# Patient Record
Sex: Female | Born: 1960
Health system: Southern US, Community
[De-identification: ages and names within clinical notes are randomized; demographics above are authoritative.]

## PROBLEM LIST (undated history)

## (undated) DIAGNOSIS — J349 Unspecified disorder of nose and nasal sinuses: Secondary | ICD-10-CM

## (undated) DIAGNOSIS — G4700 Insomnia, unspecified: Secondary | ICD-10-CM

## (undated) DIAGNOSIS — F419 Anxiety disorder, unspecified: Secondary | ICD-10-CM

## (undated) HISTORY — PX: WISDOM TOOTH EXTRACTION: SHX21

## (undated) HISTORY — PX: TONSILLECTOMY: SUR1361

---

## 2008-03-23 HISTORY — PX: LIPOSUCTION TRUNK: SUR833

## 2008-03-23 HISTORY — PX: BREAST ENHANCEMENT SURGERY: SHX7

## 2012-01-08 NOTE — H&P (Signed)
Assessment  . Facial pain   (784.0) . Pain in TMJ   (526.9) . Abnormal auditory perception   (388.40) . Tinnitus   (388.30) . Rhinitis   (472.0) Orders  Audiological Evaluation; Comprehensive Audiometry; Tympanometry Bilateral; Tympanometry Bilateral; Requested for: 12 Oct 2011. GENT Radiology Study (In Office); [phone] CT Limited Sinus w/o contrast (Axial); Requested for: 12 Oct 2011. Amoxicillin-Pot Clavulanate 875-125 MG Oral Tablet;TAKE 1 TABLET EVERY 12 HOURS DAILY; Qty42; R0; Rx. PredniSONE 20 MG Oral Tablet;TAKE 3 TABLETS DAILY FOR 3 DAYS, THEN 2 TABLETS DAILY FOR 3 DAYS, THEN 1 TABLET DAILY FOR 3 DAYS; Qty22; R0; Rx. Discussed  Significant chronic pansinusitis. We'll treat with Augmentin and a tapered course of prednisone for 3 weeks. Recheck with repeat imaging at that point. We discussed possible future surgical intervention if things do not clear up. Hearing and ears are normal. Reason For Visit  Melissa Stephenson is here today at the kind request of DEWEY, ELIZABETH for consultation and opinion for ringing in ears and sinus. HPI  Several year history of recurring bouts of sinus symptoms including facial pain and pressure, nasal congestion. She has been treated with multiple courses of antibiotics without any significant relief. She had plain x-rays of the sinuses last year at an urgent care center and was told she had a sinus infection. She has minimal nasal discharge. She does have sometimes nasal congestion. She also complains of some probable loss of hearing and a pulsatile tinnitus that occurs only occasionally. It seems to be worse on the left. She has had problems with wax buildup and itching of her ears. She is under a lot of stress. Allergies  No Known Drug Allergies. Current Meds  Amitriptyline HCl - 10 MG Oral Tablet;; RPT Zolpidem Tartrate 10 MG Oral Tablet;; RPT. PSH  Oral Surgery Tooth Extraction Tonsillectomy. Family Hx  Family history of Diabetes Mellitus. Personal Hx   Alcohol Use;  * 2 drinks or less a day, 12 Oct 2011 Never A Smoker. ROS  12 system ROS was obtained and reviewed on the Health Maintenance form dated today.  Positive responses are shown above.  If the symptom is not checked, the patient has denied it. Vital Signs   Recorded by [facility] on 12 Oct 2011 02:13 PM BP:110/60,  Height: 69.5 in, Weight: 158 lb, BMI: 23 kg/m2,  BSA Calculated: 1.88 ,  BMI Calculated: 23.00. Physical Exam  APPEARANCE: Well developed, well nourished, in no acute distress.  Normal affect, in a pleasant mood.  Oriented to time, place and person. COMMUNICATION: Normal voice   HEAD & FACE:  No scars, lesions or masses of head and face.  Sinuses nontender to palpation.  Salivary glands without mass or tenderness.  Facial strength symmetric.  No facial lesion, scars, or mass. EYES: EOMI with normal primary gaze alignment. Visual acuity grossly intact.  PERRLA EXTERNAL EAR & NOSE: No scars, lesions or masses  EAC & TYMPANIC MEMBRANE:  EAC shows no obstructing lesions or debris and tympanic membranes are normal bilaterally with good movement to insufflation. GROSS HEARING: Normal   TMJ: There is some anterior subluxation of the mandible when she opens. There is an audible click at times also when she opens. INTRANASAL EXAM: No polyps or purulence.  NASOPHARYNX: Normal, without lesions. LIPS, TEETH & GUMS: No lip lesions, normal dentition and normal gums. ORAL CAVITY/OROPHARYNX:  Oral mucosa moist without lesion or asymmetry of the palate, tongue, tonsil or posterior pharynx. NECK:  Supple without adenopathy or mass. THYROID:  Normal with no masses palpable.  NEUROLOGIC:  No gross CN deficits. No nystagmus noted.   LYMPHATIC:  No enlarged nodes palpable. Results  CT reveals chronic pansinusitis with possible polypoid component.   Tympanograms are normal in the audiogram reveals minimal high-frequency hearing loss. Signature  Electronically signed by : Serena Colonel  M.D.; 10/12/2011 4:01 PM EST.

## 2012-01-14 ENCOUNTER — Encounter (HOSPITAL_BASED_OUTPATIENT_CLINIC_OR_DEPARTMENT_OTHER): Payer: Self-pay | Admitting: *Deleted

## 2012-01-14 NOTE — Progress Notes (Signed)
Pt working Proofreader finished antibiotic for URI-can take Claritin. No cardiac or resp problems

## 2012-01-18 ENCOUNTER — Encounter (HOSPITAL_BASED_OUTPATIENT_CLINIC_OR_DEPARTMENT_OTHER): Payer: Self-pay | Admitting: *Deleted

## 2012-01-18 ENCOUNTER — Ambulatory Visit (HOSPITAL_BASED_OUTPATIENT_CLINIC_OR_DEPARTMENT_OTHER)
Admission: RE | Admit: 2012-01-18 | Discharge: 2012-01-18 | Disposition: A | Discharge status: Home / Self Care | Payer: BC Managed Care – PPO | Source: Ambulatory Visit | Attending: Otolaryngology | Admitting: Otolaryngology

## 2012-01-18 ENCOUNTER — Encounter (HOSPITAL_BASED_OUTPATIENT_CLINIC_OR_DEPARTMENT_OTHER): Payer: Self-pay | Admitting: Anesthesiology

## 2012-01-18 ENCOUNTER — Ambulatory Visit (HOSPITAL_BASED_OUTPATIENT_CLINIC_OR_DEPARTMENT_OTHER): Payer: BC Managed Care – PPO | Admitting: Anesthesiology

## 2012-01-18 ENCOUNTER — Encounter (HOSPITAL_BASED_OUTPATIENT_CLINIC_OR_DEPARTMENT_OTHER)
Admission: RE | Disposition: A | Discharge status: Home / Self Care | Payer: Self-pay | Source: Ambulatory Visit | Attending: Otolaryngology

## 2012-01-18 DIAGNOSIS — J329 Chronic sinusitis, unspecified: Secondary | ICD-10-CM | POA: Insufficient documentation

## 2012-01-18 DIAGNOSIS — J338 Other polyp of sinus: Secondary | ICD-10-CM | POA: Insufficient documentation

## 2012-01-18 HISTORY — DX: Unspecified disorder of nose and nasal sinuses: J34.9

## 2012-01-18 HISTORY — PX: NASAL SINUS SURGERY: SHX719

## 2012-01-18 HISTORY — DX: Insomnia, unspecified: G47.00

## 2012-01-18 HISTORY — DX: Anxiety disorder, unspecified: F41.9

## 2012-01-18 LAB — POCT HEMOGLOBIN-HEMACUE: Hemoglobin: 12 g/dL (ref 12.0–15.0)

## 2012-01-18 SURGERY — SINUS SURGERY, ENDOSCOPIC
Anesthesia: General | Site: Nose | Laterality: Bilateral | Wound class: Clean Contaminated

## 2012-01-18 MED ORDER — PROMETHAZINE HCL 25 MG RE SUPP: 25.0000 mg | Freq: Four times a day (QID) | RECTAL | Status: DC | PRN

## 2012-01-18 MED ORDER — DEXAMETHASONE SODIUM PHOSPHATE 4 MG/ML IJ SOLN
INTRAMUSCULAR | Status: DC | PRN
  Administered 2012-01-18: 10 mg via INTRAVENOUS

## 2012-01-18 MED ORDER — LACTATED RINGERS IV SOLN
INTRAVENOUS | Status: DC
  Administered 2012-01-18 (×2): via INTRAVENOUS

## 2012-01-18 MED ORDER — HYDROCODONE-ACETAMINOPHEN 7.5-325 MG PO TABS: 1.0000 | ORAL_TABLET | Freq: Four times a day (QID) | ORAL | Status: DC | PRN

## 2012-01-18 MED ORDER — CEFAZOLIN SODIUM-DEXTROSE 2-3 GM-% IV SOLR
2.0000 g | INTRAVENOUS | Status: AC
  Administered 2012-01-18: 2 g via INTRAVENOUS

## 2012-01-18 MED ORDER — GLYCOPYRROLATE 0.2 MG/ML IJ SOLN
0.2000 mg | Freq: Once | INTRAMUSCULAR | Status: AC | PRN
  Administered 2012-01-18: 0.2 mg via INTRAVENOUS

## 2012-01-18 MED ORDER — MIDAZOLAM HCL 5 MG/5ML IJ SOLN
INTRAMUSCULAR | Status: DC | PRN
  Administered 2012-01-18: 2 mg via INTRAVENOUS

## 2012-01-18 MED ORDER — PROPOFOL 10 MG/ML IV BOLUS
INTRAVENOUS | Status: DC | PRN
  Administered 2012-01-18: 200 mg via INTRAVENOUS

## 2012-01-18 MED ORDER — LIDOCAINE HCL (CARDIAC) 10 MG/ML IV SOLN: INTRAVENOUS | Status: DC | PRN

## 2012-01-18 MED ORDER — LIDOCAINE-EPINEPHRINE 1 %-1:100000 IJ SOLN
INTRAMUSCULAR | Status: DC | PRN
  Administered 2012-01-18: 9 mL

## 2012-01-18 MED ORDER — LIDOCAINE HCL (CARDIAC) 10 MG/ML IV SOLN
INTRAVENOUS | Status: DC | PRN
  Administered 2012-01-18: 50 mg via INTRAVENOUS

## 2012-01-18 MED ORDER — CEPHALEXIN 500 MG PO CAPS: 500.0000 mg | ORAL_CAPSULE | Freq: Three times a day (TID) | ORAL | Status: DC

## 2012-01-18 MED ORDER — ROCURONIUM BROMIDE 100 MG/10ML IV SOLN
INTRAVENOUS | Status: DC | PRN
  Administered 2012-01-18: 50 mg via INTRAVENOUS

## 2012-01-18 MED ORDER — NEOSTIGMINE METHYLSULFATE 1 MG/ML IJ SOLN
INTRAMUSCULAR | Status: DC | PRN
  Administered 2012-01-18: 3 mg via INTRAVENOUS

## 2012-01-18 MED ORDER — OXYMETAZOLINE HCL 0.05 % NA SOLN
NASAL | Status: DC | PRN
  Administered 2012-01-18: 1 via NASAL

## 2012-01-18 MED ORDER — METOCLOPRAMIDE HCL 5 MG/ML IJ SOLN
INTRAMUSCULAR | Status: DC | PRN
  Administered 2012-01-18: 10 mg via INTRAVENOUS

## 2012-01-18 MED ORDER — HYDROMORPHONE HCL PF 1 MG/ML IJ SOLN
0.2500 mg | INTRAMUSCULAR | Status: DC | PRN
  Administered 2012-01-18 (×4): 0.5 mg via INTRAVENOUS

## 2012-01-18 MED ORDER — BACIT-POLY-NEO HC 1 % EX OINT
TOPICAL_OINTMENT | CUTANEOUS | Status: DC | PRN
  Administered 2012-01-18: 1 via TOPICAL

## 2012-01-18 MED ORDER — OXYMETAZOLINE HCL 0.05 % NA SOLN
2.0000 | NASAL | Status: DC
  Administered 2012-01-18 (×2): 2 via NASAL

## 2012-01-18 MED ORDER — ONDANSETRON HCL 4 MG/2ML IJ SOLN: 4.0000 mg | Freq: Once | INTRAMUSCULAR | Status: DC | PRN

## 2012-01-18 MED ORDER — GLYCOPYRROLATE 0.2 MG/ML IJ SOLN
INTRAMUSCULAR | Status: DC | PRN
  Administered 2012-01-18: 0.4 mg via INTRAVENOUS
  Administered 2012-01-18: 0.2 mg via INTRAVENOUS

## 2012-01-18 MED ORDER — OXYCODONE HCL 5 MG/5ML PO SOLN: 5.0000 mg | Freq: Once | ORAL | Status: AC | PRN

## 2012-01-18 MED ORDER — ONDANSETRON HCL 4 MG/2ML IJ SOLN
INTRAMUSCULAR | Status: DC | PRN
  Administered 2012-01-18 (×2): 4 mg via INTRAVENOUS

## 2012-01-18 MED ORDER — OXYCODONE HCL 5 MG PO TABS
5.0000 mg | ORAL_TABLET | Freq: Once | ORAL | Status: AC | PRN
  Administered 2012-01-18: 5 mg via ORAL

## 2012-01-18 MED ORDER — FENTANYL CITRATE 0.05 MG/ML IJ SOLN
INTRAMUSCULAR | Status: DC | PRN
  Administered 2012-01-18: 50 ug via INTRAVENOUS
  Administered 2012-01-18 (×2): 25 ug via INTRAVENOUS

## 2012-01-18 SURGICAL SUPPLY — 48 items
ATTRACTOMAT 16X20 MAGNETIC DRP (DRAPES) ×2 IMPLANT
BLADE RAD40 ROTATE 4M 4 5PK (BLADE) IMPLANT
BLADE RAD60 ROTATE M4 4 5PK (BLADE) IMPLANT
BLADE ROTATE RAD12 5PK M4 4MM (BLADE) IMPLANT
BLADE TRICUT ROTATE M4 4 5PK (BLADE) ×2 IMPLANT
BUR HS RAD FRONTAL 3 (BURR) IMPLANT
CANISTER SUC SOCK COL 7 IN (MISCELLANEOUS) ×2 IMPLANT
CANISTER SUCTION 1200CC (MISCELLANEOUS) ×4 IMPLANT
CATH SINUS BALLN 7X16 (CATHETERS) IMPLANT
CATH SINUS BALLN RELIEV 6X16 (SINUPLASTY) IMPLANT
CATH SINUS GUIDE F-70 (CATHETERS) IMPLANT
CATH SINUS GUIDE M/110 (CATHETERS) IMPLANT
CATH SINUS IRRIGATION 2.0 (CATHETERS) IMPLANT
CLOTH BEACON ORANGE TIMEOUT ST (SAFETY) ×2 IMPLANT
CORDS BIPOLAR (ELECTRODE) IMPLANT
DECANTER SPIKE VIAL GLASS SM (MISCELLANEOUS) ×2 IMPLANT
DEVICE INFLATION 20/61 (MISCELLANEOUS) IMPLANT
DRESSING ADAPTIC 1/2  N-ADH (PACKING) IMPLANT
DRESSING NASAL KENNEDY 3.5X.9 (MISCELLANEOUS) IMPLANT
DRSG NASAL KENNEDY 3.5X.9 (MISCELLANEOUS)
DRSG NASAL KENNEDY LMNT 8CM (GAUZE/BANDAGES/DRESSINGS) IMPLANT
DRSG NASOPORE 8CM (GAUZE/BANDAGES/DRESSINGS) ×2 IMPLANT
DRSG TELFA 3X8 NADH (GAUZE/BANDAGES/DRESSINGS) IMPLANT
GAUZE SPONGE 4X4 16PLY XRAY LF (GAUZE/BANDAGES/DRESSINGS) IMPLANT
GAUZE VASELINE FOILPK 1/2 X 72 (GAUZE/BANDAGES/DRESSINGS) IMPLANT
GLOVE BIO SURGEON STRL SZ 6.5 (GLOVE) ×2 IMPLANT
GLOVE ECLIPSE 7.5 STRL STRAW (GLOVE) ×2 IMPLANT
GOWN PREVENTION PLUS XLARGE (GOWN DISPOSABLE) ×4 IMPLANT
HANDLE SINUS GUIDE LP (INSTRUMENTS) IMPLANT
HEMOSTAT SURGICEL .5X2 ABSORB (HEMOSTASIS) IMPLANT
IV NS 500ML (IV SOLUTION) ×1
IV NS 500ML BAXH (IV SOLUTION) ×1 IMPLANT
NEEDLE 27GAX1X1/2 (NEEDLE) ×2 IMPLANT
NEEDLE SPNL 25GX3.5 QUINCKE BL (NEEDLE) IMPLANT
NS IRRIG 1000ML POUR BTL (IV SOLUTION) ×2 IMPLANT
PACK BASIN DAY SURGERY FS (CUSTOM PROCEDURE TRAY) ×2 IMPLANT
PACK ENT DAY SURGERY (CUSTOM PROCEDURE TRAY) ×2 IMPLANT
PATTIES SURGICAL .5 X3 (DISPOSABLE) ×2 IMPLANT
SLEEVE SCD COMPRESS KNEE MED (MISCELLANEOUS) ×2 IMPLANT
SOLUTION BUTLER CLEAR DIP (MISCELLANEOUS) ×2 IMPLANT
SPONGE GAUZE 2X2 8PLY STRL LF (GAUZE/BANDAGES/DRESSINGS) ×2 IMPLANT
SPONGE SURGIFOAM ABS GEL 12-7 (HEMOSTASIS) IMPLANT
SYSTEM RELIEVA LUMA ILLUM (SINUPLASTY) IMPLANT
TOWEL OR 17X24 6PK STRL BLUE (TOWEL DISPOSABLE) ×4 IMPLANT
TOWEL OR NON WOVEN STRL DISP B (DISPOSABLE) ×2 IMPLANT
TUBE CONNECTING 20X1/4 (TUBING) ×2 IMPLANT
WATER STERILE IRR 1000ML POUR (IV SOLUTION) IMPLANT
YANKAUER SUCT BULB TIP NO VENT (SUCTIONS) ×2 IMPLANT

## 2012-01-18 NOTE — Transfer of Care (Signed)
Immediate Anesthesia Transfer of Care Note  Patient: Melissa Stephenson  Procedure(s) Performed: Procedure(s) (LRB) with comments: ENDOSCOPIC SINUS SURGERY (Bilateral) - Polypectomy also  Patient Location: PACU  Anesthesia Type:General  Level of Consciousness: sedated and patient cooperative  Airway & Oxygen Therapy: Patient Spontanous Breathing and aerosol face mask  Post-op Assessment: Report given to PACU RN and Post -op Vital signs reviewed and stable  Post vital signs: Reviewed and stable  Complications: No apparent anesthesia complications

## 2012-01-18 NOTE — Progress Notes (Signed)
Robinul 0.2mg  given iv by Derek Jack, crna for vagal response after iv started

## 2012-01-18 NOTE — Anesthesia Preprocedure Evaluation (Signed)
Anesthesia Evaluation  Patient identified by MRN, date of birth, ID band Patient awake    Reviewed: Allergy & Precautions, H&P , NPO status , Patient's Chart, lab work & pertinent test results  Airway Mallampati: I TM Distance: >3 FB Neck ROM: Full    Dental  (+) Teeth Intact and Dental Advisory Given   Pulmonary  breath sounds clear to auscultation        Cardiovascular Rhythm:Regular Rate:Normal     Neuro/Psych Anxiety    GI/Hepatic   Endo/Other    Renal/GU      Musculoskeletal   Abdominal   Peds  Hematology   Anesthesia Other Findings   Reproductive/Obstetrics                           Anesthesia Physical Anesthesia Plan  ASA: I  Anesthesia Plan: General   Post-op Pain Management:    Induction: Intravenous  Airway Management Planned: Oral ETT  Additional Equipment:   Intra-op Plan:   Post-operative Plan: Extubation in OR  Informed Consent: I have reviewed the patients History and Physical, chart, labs and discussed the procedure including the risks, benefits and alternatives for the proposed anesthesia with the patient or authorized representative who has indicated his/her understanding and acceptance.   Dental advisory given  Plan Discussed with: CRNA, Anesthesiologist and Surgeon  Anesthesia Plan Comments:         Anesthesia Quick Evaluation

## 2012-01-18 NOTE — Anesthesia Procedure Notes (Signed)
Procedure Name: Intubation Date/Time: 01/18/2012 7:48 AM Performed by: Gar Gibbon Pre-anesthesia Checklist: Patient identified, Emergency Drugs available, Suction available and Patient being monitored Patient Re-evaluated:Patient Re-evaluated prior to inductionOxygen Delivery Method: Circle System Utilized Preoxygenation: Pre-oxygenation with 100% oxygen Intubation Type: IV induction Ventilation: Mask ventilation without difficulty Laryngoscope Size: Mac and 3 Grade View: Grade II Tube type: Oral Rae Tube size: 7.0 mm Number of attempts: 1 Airway Equipment and Method: stylet and oral airway Placement Confirmation: ETT inserted through vocal cords under direct vision,  positive ETCO2 and breath sounds checked- equal and bilateral Secured at: 20 cm Tube secured with: Tape Dental Injury: Teeth and Oropharynx as per pre-operative assessment

## 2012-01-18 NOTE — Interval H&P Note (Signed)
History and Physical Interval Note:  01/18/2012 7:31 AM  Melissa Stephenson  has presented today for surgery, with the diagnosis of chronic sinusitis  The various methods of treatment have been discussed with the patient and family. After consideration of risks, benefits and other options for treatment, the patient has consented to  Procedure(s) (LRB) with comments: ENDOSCOPIC SINUS SURGERY (Bilateral) as a surgical intervention .  The patient's history has been reviewed, patient examined, no change in status, stable for surgery.  I have reviewed the patient's chart and labs.  Questions were answered to the patient's satisfaction.     [name]

## 2012-01-18 NOTE — Anesthesia Postprocedure Evaluation (Addendum)
  Anesthesia Post-op Note  Patient: Melissa Stephenson  Procedure(s) Performed: Procedure(s) (LRB) with comments: ENDOSCOPIC SINUS SURGERY (Bilateral) - Polypectomy also  Patient Location: PACU  Anesthesia Type: General  Level of Consciousness: awake, alert  and oriented  Airway and Oxygen Therapy: Patient Spontanous Breathing and Patient connected to face mask oxygen  Post-op Pain: mild  Post-op Assessment: Post-op Vital signs reviewed  Post-op Vital Signs: Reviewed  Complications: No apparent anesthesia complications

## 2012-01-18 NOTE — Addendum Note (Signed)
Addendum  created 01/18/12 1623 by Kerby Nora, MD   Modules edited:Notes Section

## 2012-01-18 NOTE — Op Note (Signed)
OPERATIVE REPORT  DATE OF SURGERY: 01/18/2012  PATIENT:  Melissa Stephenson,  51 y.o. female  PRE-OPERATIVE DIAGNOSIS:  chronic sinusitis  POST-OPERATIVE DIAGNOSIS:  chronic sinusitis  PROCEDURE:  Procedure(s): ENDOSCOPIC SINUS SURGERY  SURGEON:  Susy Frizzle, MD  ASSISTANTS: none   ANESTHESIA:   general  EBL:  100 ml  DRAINS: none   LOCAL MEDICATIONS USED:  OTHER 1% Xylocaine with epinephrine  SPECIMEN:  Source of Specimen:  Bilateral nasal and sinus contents  COUNTS:  YES  PROCEDURE DETAILS: The patient was taken to the operating room and placed on the operating table in the supine position. Following induction of general endotracheal anesthesia, the nose was prepped and draped in a standard fashion. Afrin spray was used preoperatively. 1% Xylocaine with epinephrine was infiltrated into the superior and posterior attachments of the middle turbinates. Initially it was unable to visualize the turbinates because of the polypoid disease. Once the polyps were debrided back and I was able to visualize the turbinates injections were completed.  #1 extensive bilateral endoscopic nasal polypectomy. A 0 nasal endoscope and microdebrider were used to remove polypoid disease that was seen arising from the middle meatus and superior meatus bilaterally. There was more extensive disease on the right than left. #2 bilateral endoscopic total ethmoidectomy. After the initial polypectomy was completed, ethmoidectomies were performed bilaterally using the microdebrider to follow back through the infundibulum into the ethmoid bulla on both sides, and then following back toward the fovea. The fovea ethmoidalis was intact bilaterally and was the superior limit of dissection. The lamina papyracea was the lateral limit on both sides and was also intact. A backbiter was used to remove fragments of the uncinate process on both sides. The ground lamella was taken down exposing the posterior ethmoid cells.  There is diffuse polypoid disease present throughout the ethmoid complex bilaterally.  #3 bilateral endoscopic maxillary antrostomy with removal of polypoid disease. After the ethmoid dissection was completed, a curved suction was used to enter into the maxillary antrum bilaterally. Backbiting forceps were used to enlarge the antrostomy posteriorly and the debrider was used to extend the antrostomy more posteriorly. There was pus in the left maxillary antrum that was suctioned out. There is extensive polypoid disease bilaterally that was cleaned out using suction and angled forceps. #4 bilateral endoscopic frontal sinusotomy. After the ethmoid dissection was completed, the frontal recess was inspected using a 30 and then 70 nasal endoscope and a curved suction. The frontal duct was opened using the suction and a giraffe forcep was used to remove polypoid tissue that was growing around and in the frontal duct on both sides. These were cleaned out completely and there was nice transillumination of the frontal sinuses after this maneuver. #5 bilateral endoscopic sphenoidotomy. After the ethmoid dissection was completed, the sphenoethmoidal recess was inspected and cleaned of surrounding polypoid disease. The sphenoid ostium was opened bilaterally using a straight suction and a 0 endoscope. Extensive polypoid disease was debrided from around the sphenoidotomy site bilaterally. On the right side, the sinus was filled with polyps and pus and this was all suctioned out. At the termination of the all of these procedures, the sinus cavities were suctioned of blood and packed with Afrin soaked pledges for several minutes followed by NasalPore coated with Cortisporin ointment. The pharynx was suctioned of secretions and the patient was awakened extubated and transferred to recovery in stable condition.   PATIENT DISPOSITION:  PACU - hemodynamically stable.

## 2012-01-19 ENCOUNTER — Encounter (HOSPITAL_BASED_OUTPATIENT_CLINIC_OR_DEPARTMENT_OTHER): Payer: Self-pay | Admitting: Otolaryngology

## 2012-01-22 ENCOUNTER — Encounter (HOSPITAL_BASED_OUTPATIENT_CLINIC_OR_DEPARTMENT_OTHER): Payer: Self-pay

## 2014-06-21 ENCOUNTER — Other Ambulatory Visit: Payer: Self-pay | Admitting: Obstetrics and Gynecology

## 2014-06-22 LAB — CYTOLOGY - PAP

## 2015-07-01 DIAGNOSIS — H04123 Dry eye syndrome of bilateral lacrimal glands: Secondary | ICD-10-CM | POA: Diagnosis not present

## 2015-07-26 DIAGNOSIS — Z6822 Body mass index (BMI) 22.0-22.9, adult: Secondary | ICD-10-CM | POA: Diagnosis not present

## 2015-07-26 DIAGNOSIS — Z01419 Encounter for gynecological examination (general) (routine) without abnormal findings: Secondary | ICD-10-CM | POA: Diagnosis not present

## 2015-07-26 DIAGNOSIS — Z1329 Encounter for screening for other suspected endocrine disorder: Secondary | ICD-10-CM | POA: Diagnosis not present

## 2015-07-26 DIAGNOSIS — Z1321 Encounter for screening for nutritional disorder: Secondary | ICD-10-CM | POA: Diagnosis not present

## 2015-07-26 DIAGNOSIS — Z131 Encounter for screening for diabetes mellitus: Secondary | ICD-10-CM | POA: Diagnosis not present

## 2015-07-26 DIAGNOSIS — Z1231 Encounter for screening mammogram for malignant neoplasm of breast: Secondary | ICD-10-CM | POA: Diagnosis not present

## 2015-07-26 DIAGNOSIS — Z13220 Encounter for screening for lipoid disorders: Secondary | ICD-10-CM | POA: Diagnosis not present

## 2015-08-02 DIAGNOSIS — Z6821 Body mass index (BMI) 21.0-21.9, adult: Secondary | ICD-10-CM | POA: Diagnosis not present

## 2015-08-02 DIAGNOSIS — N959 Unspecified menopausal and perimenopausal disorder: Secondary | ICD-10-CM | POA: Diagnosis not present

## 2015-08-02 DIAGNOSIS — F411 Generalized anxiety disorder: Secondary | ICD-10-CM | POA: Diagnosis not present

## 2015-08-02 DIAGNOSIS — G4700 Insomnia, unspecified: Secondary | ICD-10-CM | POA: Diagnosis not present

## 2015-08-28 DIAGNOSIS — L821 Other seborrheic keratosis: Secondary | ICD-10-CM | POA: Diagnosis not present

## 2015-08-28 DIAGNOSIS — D235 Other benign neoplasm of skin of trunk: Secondary | ICD-10-CM | POA: Diagnosis not present

## 2015-08-28 DIAGNOSIS — L738 Other specified follicular disorders: Secondary | ICD-10-CM | POA: Diagnosis not present

## 2015-08-28 DIAGNOSIS — L820 Inflamed seborrheic keratosis: Secondary | ICD-10-CM | POA: Diagnosis not present

## 2015-10-15 DIAGNOSIS — G4700 Insomnia, unspecified: Secondary | ICD-10-CM | POA: Diagnosis not present

## 2015-10-15 DIAGNOSIS — F411 Generalized anxiety disorder: Secondary | ICD-10-CM | POA: Diagnosis not present

## 2015-10-15 DIAGNOSIS — N959 Unspecified menopausal and perimenopausal disorder: Secondary | ICD-10-CM | POA: Diagnosis not present

## 2015-10-15 DIAGNOSIS — H6121 Impacted cerumen, right ear: Secondary | ICD-10-CM | POA: Diagnosis not present

## 2015-11-12 DIAGNOSIS — D485 Neoplasm of uncertain behavior of skin: Secondary | ICD-10-CM | POA: Diagnosis not present

## 2015-11-12 DIAGNOSIS — L820 Inflamed seborrheic keratosis: Secondary | ICD-10-CM | POA: Diagnosis not present

## 2015-11-12 DIAGNOSIS — D2239 Melanocytic nevi of other parts of face: Secondary | ICD-10-CM | POA: Diagnosis not present

## 2015-11-12 DIAGNOSIS — L570 Actinic keratosis: Secondary | ICD-10-CM | POA: Diagnosis not present

## 2015-12-30 DIAGNOSIS — F411 Generalized anxiety disorder: Secondary | ICD-10-CM | POA: Diagnosis not present

## 2015-12-30 DIAGNOSIS — N959 Unspecified menopausal and perimenopausal disorder: Secondary | ICD-10-CM | POA: Diagnosis not present

## 2015-12-30 DIAGNOSIS — G479 Sleep disorder, unspecified: Secondary | ICD-10-CM | POA: Diagnosis not present

## 2015-12-30 DIAGNOSIS — Z23 Encounter for immunization: Secondary | ICD-10-CM | POA: Diagnosis not present

## 2016-01-28 DIAGNOSIS — Z136 Encounter for screening for cardiovascular disorders: Secondary | ICD-10-CM | POA: Diagnosis not present

## 2016-01-28 DIAGNOSIS — Z0000 Encounter for general adult medical examination without abnormal findings: Secondary | ICD-10-CM | POA: Diagnosis not present

## 2016-02-03 DIAGNOSIS — Z23 Encounter for immunization: Secondary | ICD-10-CM | POA: Diagnosis not present

## 2016-02-03 DIAGNOSIS — Z0000 Encounter for general adult medical examination without abnormal findings: Secondary | ICD-10-CM | POA: Diagnosis not present

## 2016-02-03 DIAGNOSIS — H6121 Impacted cerumen, right ear: Secondary | ICD-10-CM | POA: Diagnosis not present

## 2016-02-03 DIAGNOSIS — Z6822 Body mass index (BMI) 22.0-22.9, adult: Secondary | ICD-10-CM | POA: Diagnosis not present

## 2016-04-13 DIAGNOSIS — H5213 Myopia, bilateral: Secondary | ICD-10-CM | POA: Diagnosis not present

## 2016-04-22 DIAGNOSIS — B9789 Other viral agents as the cause of diseases classified elsewhere: Secondary | ICD-10-CM | POA: Diagnosis not present

## 2016-04-22 DIAGNOSIS — J069 Acute upper respiratory infection, unspecified: Secondary | ICD-10-CM | POA: Diagnosis not present

## 2016-05-01 DIAGNOSIS — H6122 Impacted cerumen, left ear: Secondary | ICD-10-CM | POA: Diagnosis not present

## 2016-05-01 DIAGNOSIS — J329 Chronic sinusitis, unspecified: Secondary | ICD-10-CM | POA: Diagnosis not present

## 2016-05-01 DIAGNOSIS — B349 Viral infection, unspecified: Secondary | ICD-10-CM | POA: Diagnosis not present

## 2016-05-01 DIAGNOSIS — Z6822 Body mass index (BMI) 22.0-22.9, adult: Secondary | ICD-10-CM | POA: Diagnosis not present

## 2016-08-10 DIAGNOSIS — Z01419 Encounter for gynecological examination (general) (routine) without abnormal findings: Secondary | ICD-10-CM | POA: Diagnosis not present

## 2016-08-10 DIAGNOSIS — Z1231 Encounter for screening mammogram for malignant neoplasm of breast: Secondary | ICD-10-CM | POA: Diagnosis not present

## 2016-08-10 DIAGNOSIS — Z6823 Body mass index (BMI) 23.0-23.9, adult: Secondary | ICD-10-CM | POA: Diagnosis not present

## 2016-08-11 DIAGNOSIS — G4700 Insomnia, unspecified: Secondary | ICD-10-CM | POA: Diagnosis not present

## 2016-08-11 DIAGNOSIS — N959 Unspecified menopausal and perimenopausal disorder: Secondary | ICD-10-CM | POA: Diagnosis not present

## 2016-08-11 DIAGNOSIS — Z6821 Body mass index (BMI) 21.0-21.9, adult: Secondary | ICD-10-CM | POA: Diagnosis not present

## 2016-08-11 DIAGNOSIS — F411 Generalized anxiety disorder: Secondary | ICD-10-CM | POA: Diagnosis not present

## 2016-08-27 DIAGNOSIS — L570 Actinic keratosis: Secondary | ICD-10-CM | POA: Diagnosis not present

## 2016-08-27 DIAGNOSIS — I788 Other diseases of capillaries: Secondary | ICD-10-CM | POA: Diagnosis not present

## 2016-08-27 DIAGNOSIS — D235 Other benign neoplasm of skin of trunk: Secondary | ICD-10-CM | POA: Diagnosis not present

## 2016-08-27 DIAGNOSIS — L738 Other specified follicular disorders: Secondary | ICD-10-CM | POA: Diagnosis not present

## 2016-08-27 DIAGNOSIS — L578 Other skin changes due to chronic exposure to nonionizing radiation: Secondary | ICD-10-CM | POA: Diagnosis not present

## 2016-08-30 DIAGNOSIS — Z79899 Other long term (current) drug therapy: Secondary | ICD-10-CM | POA: Diagnosis not present

## 2016-08-30 DIAGNOSIS — S92919A Unspecified fracture of unspecified toe(s), initial encounter for closed fracture: Secondary | ICD-10-CM | POA: Diagnosis not present

## 2016-08-30 DIAGNOSIS — S91319A Laceration without foreign body, unspecified foot, initial encounter: Secondary | ICD-10-CM | POA: Diagnosis not present

## 2016-08-30 DIAGNOSIS — M79673 Pain in unspecified foot: Secondary | ICD-10-CM | POA: Diagnosis not present

## 2016-09-11 DIAGNOSIS — S91319A Laceration without foreign body, unspecified foot, initial encounter: Secondary | ICD-10-CM | POA: Diagnosis not present

## 2016-09-11 DIAGNOSIS — S92919A Unspecified fracture of unspecified toe(s), initial encounter for closed fracture: Secondary | ICD-10-CM | POA: Diagnosis not present

## 2017-02-08 DIAGNOSIS — Z0000 Encounter for general adult medical examination without abnormal findings: Secondary | ICD-10-CM | POA: Diagnosis not present

## 2017-02-08 DIAGNOSIS — Z13220 Encounter for screening for lipoid disorders: Secondary | ICD-10-CM | POA: Diagnosis not present

## 2017-02-08 DIAGNOSIS — Z1329 Encounter for screening for other suspected endocrine disorder: Secondary | ICD-10-CM | POA: Diagnosis not present

## 2017-02-08 DIAGNOSIS — Z114 Encounter for screening for human immunodeficiency virus [HIV]: Secondary | ICD-10-CM | POA: Diagnosis not present

## 2017-02-10 DIAGNOSIS — G4700 Insomnia, unspecified: Secondary | ICD-10-CM | POA: Diagnosis not present

## 2017-02-10 DIAGNOSIS — E559 Vitamin D deficiency, unspecified: Secondary | ICD-10-CM | POA: Diagnosis not present

## 2017-02-10 DIAGNOSIS — F411 Generalized anxiety disorder: Secondary | ICD-10-CM | POA: Diagnosis not present

## 2017-02-10 DIAGNOSIS — Z0000 Encounter for general adult medical examination without abnormal findings: Secondary | ICD-10-CM | POA: Diagnosis not present

## 2017-02-10 DIAGNOSIS — Z23 Encounter for immunization: Secondary | ICD-10-CM | POA: Diagnosis not present

## 2017-03-23 HISTORY — PX: EYE SURGERY: SHX253

## 2017-03-26 DIAGNOSIS — L859 Epidermal thickening, unspecified: Secondary | ICD-10-CM | POA: Diagnosis not present

## 2017-03-26 DIAGNOSIS — Z85828 Personal history of other malignant neoplasm of skin: Secondary | ICD-10-CM | POA: Diagnosis not present

## 2017-03-26 DIAGNOSIS — L700 Acne vulgaris: Secondary | ICD-10-CM | POA: Diagnosis not present

## 2017-04-22 DIAGNOSIS — H6123 Impacted cerumen, bilateral: Secondary | ICD-10-CM | POA: Diagnosis not present

## 2017-04-22 DIAGNOSIS — K921 Melena: Secondary | ICD-10-CM | POA: Diagnosis not present

## 2017-04-22 DIAGNOSIS — Z23 Encounter for immunization: Secondary | ICD-10-CM | POA: Diagnosis not present

## 2017-04-22 DIAGNOSIS — G4700 Insomnia, unspecified: Secondary | ICD-10-CM | POA: Diagnosis not present

## 2017-04-22 DIAGNOSIS — K649 Unspecified hemorrhoids: Secondary | ICD-10-CM | POA: Diagnosis not present

## 2017-04-22 DIAGNOSIS — D3131 Benign neoplasm of right choroid: Secondary | ICD-10-CM | POA: Diagnosis not present

## 2017-04-22 DIAGNOSIS — K5900 Constipation, unspecified: Secondary | ICD-10-CM | POA: Diagnosis not present

## 2017-06-21 DIAGNOSIS — K5900 Constipation, unspecified: Secondary | ICD-10-CM | POA: Diagnosis not present

## 2017-06-21 DIAGNOSIS — K649 Unspecified hemorrhoids: Secondary | ICD-10-CM | POA: Diagnosis not present

## 2017-06-21 DIAGNOSIS — G4700 Insomnia, unspecified: Secondary | ICD-10-CM | POA: Diagnosis not present

## 2017-06-21 DIAGNOSIS — K602 Anal fissure, unspecified: Secondary | ICD-10-CM | POA: Diagnosis not present

## 2017-07-14 DIAGNOSIS — H16223 Keratoconjunctivitis sicca, not specified as Sjogren's, bilateral: Secondary | ICD-10-CM | POA: Diagnosis not present

## 2017-07-14 DIAGNOSIS — H01009 Unspecified blepharitis unspecified eye, unspecified eyelid: Secondary | ICD-10-CM | POA: Diagnosis not present

## 2017-07-14 DIAGNOSIS — L718 Other rosacea: Secondary | ICD-10-CM | POA: Diagnosis not present

## 2017-08-18 DIAGNOSIS — H01009 Unspecified blepharitis unspecified eye, unspecified eyelid: Secondary | ICD-10-CM | POA: Diagnosis not present

## 2017-10-18 DIAGNOSIS — Z1231 Encounter for screening mammogram for malignant neoplasm of breast: Secondary | ICD-10-CM | POA: Diagnosis not present

## 2017-10-18 DIAGNOSIS — Z01419 Encounter for gynecological examination (general) (routine) without abnormal findings: Secondary | ICD-10-CM | POA: Diagnosis not present

## 2017-10-18 DIAGNOSIS — Z6824 Body mass index (BMI) 24.0-24.9, adult: Secondary | ICD-10-CM | POA: Diagnosis not present

## 2017-11-04 DIAGNOSIS — F411 Generalized anxiety disorder: Secondary | ICD-10-CM | POA: Diagnosis not present

## 2017-11-04 DIAGNOSIS — Z23 Encounter for immunization: Secondary | ICD-10-CM | POA: Diagnosis not present

## 2017-11-04 DIAGNOSIS — Z6823 Body mass index (BMI) 23.0-23.9, adult: Secondary | ICD-10-CM | POA: Diagnosis not present

## 2017-11-04 DIAGNOSIS — G4700 Insomnia, unspecified: Secondary | ICD-10-CM | POA: Diagnosis not present

## 2017-12-29 DIAGNOSIS — Z6821 Body mass index (BMI) 21.0-21.9, adult: Secondary | ICD-10-CM | POA: Diagnosis not present

## 2017-12-29 DIAGNOSIS — F411 Generalized anxiety disorder: Secondary | ICD-10-CM | POA: Diagnosis not present

## 2017-12-29 DIAGNOSIS — G4700 Insomnia, unspecified: Secondary | ICD-10-CM | POA: Diagnosis not present

## 2017-12-29 DIAGNOSIS — F329 Major depressive disorder, single episode, unspecified: Secondary | ICD-10-CM | POA: Diagnosis not present

## 2017-12-29 DIAGNOSIS — Z23 Encounter for immunization: Secondary | ICD-10-CM | POA: Diagnosis not present

## 2018-01-17 DIAGNOSIS — F411 Generalized anxiety disorder: Secondary | ICD-10-CM | POA: Diagnosis not present

## 2018-01-24 DIAGNOSIS — F411 Generalized anxiety disorder: Secondary | ICD-10-CM | POA: Diagnosis not present

## 2018-01-26 DIAGNOSIS — G4700 Insomnia, unspecified: Secondary | ICD-10-CM | POA: Diagnosis not present

## 2018-01-26 DIAGNOSIS — L280 Lichen simplex chronicus: Secondary | ICD-10-CM | POA: Diagnosis not present

## 2018-01-26 DIAGNOSIS — Z6820 Body mass index (BMI) 20.0-20.9, adult: Secondary | ICD-10-CM | POA: Diagnosis not present

## 2018-01-26 DIAGNOSIS — F411 Generalized anxiety disorder: Secondary | ICD-10-CM | POA: Diagnosis not present

## 2018-02-03 DIAGNOSIS — L570 Actinic keratosis: Secondary | ICD-10-CM | POA: Diagnosis not present

## 2018-02-03 DIAGNOSIS — L578 Other skin changes due to chronic exposure to nonionizing radiation: Secondary | ICD-10-CM | POA: Diagnosis not present

## 2018-02-03 DIAGNOSIS — D235 Other benign neoplasm of skin of trunk: Secondary | ICD-10-CM | POA: Diagnosis not present

## 2018-02-03 DIAGNOSIS — Z85828 Personal history of other malignant neoplasm of skin: Secondary | ICD-10-CM | POA: Diagnosis not present

## 2018-02-03 DIAGNOSIS — Z08 Encounter for follow-up examination after completed treatment for malignant neoplasm: Secondary | ICD-10-CM | POA: Diagnosis not present

## 2018-02-04 DIAGNOSIS — G4700 Insomnia, unspecified: Secondary | ICD-10-CM | POA: Diagnosis not present

## 2018-02-04 DIAGNOSIS — L298 Other pruritus: Secondary | ICD-10-CM | POA: Diagnosis not present

## 2018-02-04 DIAGNOSIS — F411 Generalized anxiety disorder: Secondary | ICD-10-CM | POA: Diagnosis not present

## 2018-02-04 DIAGNOSIS — Z6821 Body mass index (BMI) 21.0-21.9, adult: Secondary | ICD-10-CM | POA: Diagnosis not present

## 2018-03-03 DIAGNOSIS — Z0000 Encounter for general adult medical examination without abnormal findings: Secondary | ICD-10-CM | POA: Diagnosis not present

## 2018-03-03 DIAGNOSIS — Z13220 Encounter for screening for lipoid disorders: Secondary | ICD-10-CM | POA: Diagnosis not present

## 2018-03-03 DIAGNOSIS — Z114 Encounter for screening for human immunodeficiency virus [HIV]: Secondary | ICD-10-CM | POA: Diagnosis not present

## 2018-03-03 DIAGNOSIS — F411 Generalized anxiety disorder: Secondary | ICD-10-CM | POA: Diagnosis not present

## 2018-03-03 DIAGNOSIS — Z1329 Encounter for screening for other suspected endocrine disorder: Secondary | ICD-10-CM | POA: Diagnosis not present

## 2018-03-07 DIAGNOSIS — Z6821 Body mass index (BMI) 21.0-21.9, adult: Secondary | ICD-10-CM | POA: Diagnosis not present

## 2018-03-07 DIAGNOSIS — Z0000 Encounter for general adult medical examination without abnormal findings: Secondary | ICD-10-CM | POA: Diagnosis not present

## 2018-04-25 DIAGNOSIS — F411 Generalized anxiety disorder: Secondary | ICD-10-CM | POA: Diagnosis not present

## 2018-04-26 DIAGNOSIS — F411 Generalized anxiety disorder: Secondary | ICD-10-CM | POA: Diagnosis not present

## 2018-04-26 DIAGNOSIS — G4700 Insomnia, unspecified: Secondary | ICD-10-CM | POA: Diagnosis not present

## 2018-04-26 DIAGNOSIS — F331 Major depressive disorder, recurrent, moderate: Secondary | ICD-10-CM | POA: Diagnosis not present

## 2018-04-26 DIAGNOSIS — Z6821 Body mass index (BMI) 21.0-21.9, adult: Secondary | ICD-10-CM | POA: Diagnosis not present

## 2018-06-13 DIAGNOSIS — R9431 Abnormal electrocardiogram [ECG] [EKG]: Secondary | ICD-10-CM | POA: Diagnosis not present

## 2018-06-13 DIAGNOSIS — G4700 Insomnia, unspecified: Secondary | ICD-10-CM | POA: Diagnosis not present

## 2018-06-13 DIAGNOSIS — F411 Generalized anxiety disorder: Secondary | ICD-10-CM | POA: Diagnosis not present

## 2018-06-13 DIAGNOSIS — F331 Major depressive disorder, recurrent, moderate: Secondary | ICD-10-CM | POA: Diagnosis not present

## 2018-06-17 DIAGNOSIS — Z711 Person with feared health complaint in whom no diagnosis is made: Secondary | ICD-10-CM | POA: Diagnosis not present

## 2018-06-30 DIAGNOSIS — M25559 Pain in unspecified hip: Secondary | ICD-10-CM | POA: Diagnosis not present

## 2018-06-30 DIAGNOSIS — G4700 Insomnia, unspecified: Secondary | ICD-10-CM | POA: Diagnosis not present

## 2018-06-30 DIAGNOSIS — M1710 Unilateral primary osteoarthritis, unspecified knee: Secondary | ICD-10-CM | POA: Diagnosis not present

## 2018-06-30 DIAGNOSIS — F411 Generalized anxiety disorder: Secondary | ICD-10-CM | POA: Diagnosis not present

## 2018-08-03 DIAGNOSIS — M779 Enthesopathy, unspecified: Secondary | ICD-10-CM | POA: Diagnosis not present

## 2018-08-03 DIAGNOSIS — S76301S Unspecified injury of muscle, fascia and tendon of the posterior muscle group at thigh level, right thigh, sequela: Secondary | ICD-10-CM | POA: Diagnosis not present

## 2018-08-03 DIAGNOSIS — M1710 Unilateral primary osteoarthritis, unspecified knee: Secondary | ICD-10-CM | POA: Diagnosis not present

## 2018-08-09 DIAGNOSIS — D3131 Benign neoplasm of right choroid: Secondary | ICD-10-CM | POA: Diagnosis not present

## 2018-08-31 DIAGNOSIS — F331 Major depressive disorder, recurrent, moderate: Secondary | ICD-10-CM | POA: Diagnosis not present

## 2018-08-31 DIAGNOSIS — G4700 Insomnia, unspecified: Secondary | ICD-10-CM | POA: Diagnosis not present

## 2018-08-31 DIAGNOSIS — F411 Generalized anxiety disorder: Secondary | ICD-10-CM | POA: Diagnosis not present

## 2018-10-05 ENCOUNTER — Telehealth: Payer: Self-pay | Admitting: *Deleted

## 2018-10-05 DIAGNOSIS — G4700 Insomnia, unspecified: Secondary | ICD-10-CM | POA: Diagnosis not present

## 2018-10-05 DIAGNOSIS — F331 Major depressive disorder, recurrent, moderate: Secondary | ICD-10-CM | POA: Diagnosis not present

## 2018-10-05 DIAGNOSIS — F411 Generalized anxiety disorder: Secondary | ICD-10-CM | POA: Diagnosis not present

## 2018-10-05 DIAGNOSIS — Z20828 Contact with and (suspected) exposure to other viral communicable diseases: Secondary | ICD-10-CM | POA: Diagnosis not present

## 2018-10-05 DIAGNOSIS — Z20822 Contact with and (suspected) exposure to covid-19: Secondary | ICD-10-CM

## 2018-10-05 NOTE — Telephone Encounter (Signed)
Order placed for testing at [facility] site.  Possible exposure  Requested by Dr. Rachell Cipro  Pys CB# [phone] Practice # [phone]  Testing process reviewed, stay in car, wear mask; pt verbalizes understanding.

## 2018-10-06 ENCOUNTER — Other Ambulatory Visit: Payer: Self-pay | Admitting: Internal Medicine

## 2018-10-06 DIAGNOSIS — Z20822 Contact with and (suspected) exposure to covid-19: Secondary | ICD-10-CM

## 2018-10-11 LAB — NOVEL CORONAVIRUS, NAA: SARS-CoV-2, NAA: NOT DETECTED

## 2018-11-03 DIAGNOSIS — G479 Sleep disorder, unspecified: Secondary | ICD-10-CM | POA: Diagnosis not present

## 2018-11-03 DIAGNOSIS — F411 Generalized anxiety disorder: Secondary | ICD-10-CM | POA: Diagnosis not present

## 2018-11-03 DIAGNOSIS — F331 Major depressive disorder, recurrent, moderate: Secondary | ICD-10-CM | POA: Diagnosis not present

## 2018-12-21 DIAGNOSIS — Z6823 Body mass index (BMI) 23.0-23.9, adult: Secondary | ICD-10-CM | POA: Diagnosis not present

## 2018-12-21 DIAGNOSIS — Z1231 Encounter for screening mammogram for malignant neoplasm of breast: Secondary | ICD-10-CM | POA: Diagnosis not present

## 2018-12-21 DIAGNOSIS — Z01419 Encounter for gynecological examination (general) (routine) without abnormal findings: Secondary | ICD-10-CM | POA: Diagnosis not present

## 2018-12-30 DIAGNOSIS — Z719 Counseling, unspecified: Secondary | ICD-10-CM | POA: Diagnosis not present

## 2018-12-30 DIAGNOSIS — G4700 Insomnia, unspecified: Secondary | ICD-10-CM | POA: Diagnosis not present

## 2018-12-30 DIAGNOSIS — F411 Generalized anxiety disorder: Secondary | ICD-10-CM | POA: Diagnosis not present

## 2019-02-08 ENCOUNTER — Other Ambulatory Visit: Payer: Self-pay

## 2019-02-08 DIAGNOSIS — Z20822 Contact with and (suspected) exposure to covid-19: Secondary | ICD-10-CM

## 2019-02-09 DIAGNOSIS — G4700 Insomnia, unspecified: Secondary | ICD-10-CM | POA: Diagnosis not present

## 2019-02-09 DIAGNOSIS — F331 Major depressive disorder, recurrent, moderate: Secondary | ICD-10-CM | POA: Diagnosis not present

## 2019-02-09 DIAGNOSIS — F411 Generalized anxiety disorder: Secondary | ICD-10-CM | POA: Diagnosis not present

## 2019-02-10 LAB — NOVEL CORONAVIRUS, NAA: SARS-CoV-2, NAA: NOT DETECTED

## 2019-03-31 DIAGNOSIS — Z0000 Encounter for general adult medical examination without abnormal findings: Secondary | ICD-10-CM | POA: Diagnosis not present

## 2019-03-31 DIAGNOSIS — Z0184 Encounter for antibody response examination: Secondary | ICD-10-CM | POA: Diagnosis not present

## 2019-03-31 DIAGNOSIS — Z20828 Contact with and (suspected) exposure to other viral communicable diseases: Secondary | ICD-10-CM | POA: Diagnosis not present

## 2019-03-31 DIAGNOSIS — E782 Mixed hyperlipidemia: Secondary | ICD-10-CM | POA: Diagnosis not present

## 2019-04-04 DIAGNOSIS — Z0000 Encounter for general adult medical examination without abnormal findings: Secondary | ICD-10-CM | POA: Diagnosis not present

## 2019-04-04 DIAGNOSIS — H6121 Impacted cerumen, right ear: Secondary | ICD-10-CM | POA: Diagnosis not present

## 2019-04-04 DIAGNOSIS — Z1211 Encounter for screening for malignant neoplasm of colon: Secondary | ICD-10-CM | POA: Diagnosis not present

## 2019-04-04 DIAGNOSIS — Z6822 Body mass index (BMI) 22.0-22.9, adult: Secondary | ICD-10-CM | POA: Diagnosis not present

## 2019-04-21 DIAGNOSIS — Z13820 Encounter for screening for osteoporosis: Secondary | ICD-10-CM | POA: Diagnosis not present

## 2019-05-22 DIAGNOSIS — M9905 Segmental and somatic dysfunction of pelvic region: Secondary | ICD-10-CM | POA: Diagnosis not present

## 2019-05-22 DIAGNOSIS — M9902 Segmental and somatic dysfunction of thoracic region: Secondary | ICD-10-CM | POA: Diagnosis not present

## 2019-05-22 DIAGNOSIS — M9903 Segmental and somatic dysfunction of lumbar region: Secondary | ICD-10-CM | POA: Diagnosis not present

## 2019-05-22 DIAGNOSIS — M5431 Sciatica, right side: Secondary | ICD-10-CM | POA: Diagnosis not present

## 2019-05-23 DIAGNOSIS — F411 Generalized anxiety disorder: Secondary | ICD-10-CM | POA: Diagnosis not present

## 2019-05-24 DIAGNOSIS — M9905 Segmental and somatic dysfunction of pelvic region: Secondary | ICD-10-CM | POA: Diagnosis not present

## 2019-05-24 DIAGNOSIS — M9902 Segmental and somatic dysfunction of thoracic region: Secondary | ICD-10-CM | POA: Diagnosis not present

## 2019-05-24 DIAGNOSIS — M9903 Segmental and somatic dysfunction of lumbar region: Secondary | ICD-10-CM | POA: Diagnosis not present

## 2019-05-24 DIAGNOSIS — M5431 Sciatica, right side: Secondary | ICD-10-CM | POA: Diagnosis not present

## 2019-06-05 DIAGNOSIS — M9902 Segmental and somatic dysfunction of thoracic region: Secondary | ICD-10-CM | POA: Diagnosis not present

## 2019-06-05 DIAGNOSIS — M9903 Segmental and somatic dysfunction of lumbar region: Secondary | ICD-10-CM | POA: Diagnosis not present

## 2019-06-05 DIAGNOSIS — M5431 Sciatica, right side: Secondary | ICD-10-CM | POA: Diagnosis not present

## 2019-06-05 DIAGNOSIS — M9905 Segmental and somatic dysfunction of pelvic region: Secondary | ICD-10-CM | POA: Diagnosis not present

## 2019-06-08 DIAGNOSIS — M9905 Segmental and somatic dysfunction of pelvic region: Secondary | ICD-10-CM | POA: Diagnosis not present

## 2019-06-08 DIAGNOSIS — M9903 Segmental and somatic dysfunction of lumbar region: Secondary | ICD-10-CM | POA: Diagnosis not present

## 2019-06-08 DIAGNOSIS — M9902 Segmental and somatic dysfunction of thoracic region: Secondary | ICD-10-CM | POA: Diagnosis not present

## 2019-06-08 DIAGNOSIS — M5431 Sciatica, right side: Secondary | ICD-10-CM | POA: Diagnosis not present

## 2019-06-12 DIAGNOSIS — M9905 Segmental and somatic dysfunction of pelvic region: Secondary | ICD-10-CM | POA: Diagnosis not present

## 2019-06-12 DIAGNOSIS — M9902 Segmental and somatic dysfunction of thoracic region: Secondary | ICD-10-CM | POA: Diagnosis not present

## 2019-06-12 DIAGNOSIS — M9903 Segmental and somatic dysfunction of lumbar region: Secondary | ICD-10-CM | POA: Diagnosis not present

## 2019-06-12 DIAGNOSIS — F411 Generalized anxiety disorder: Secondary | ICD-10-CM | POA: Diagnosis not present

## 2019-06-12 DIAGNOSIS — M5431 Sciatica, right side: Secondary | ICD-10-CM | POA: Diagnosis not present

## 2019-06-15 DIAGNOSIS — M9903 Segmental and somatic dysfunction of lumbar region: Secondary | ICD-10-CM | POA: Diagnosis not present

## 2019-06-15 DIAGNOSIS — M5431 Sciatica, right side: Secondary | ICD-10-CM | POA: Diagnosis not present

## 2019-06-15 DIAGNOSIS — M9905 Segmental and somatic dysfunction of pelvic region: Secondary | ICD-10-CM | POA: Diagnosis not present

## 2019-06-15 DIAGNOSIS — M9902 Segmental and somatic dysfunction of thoracic region: Secondary | ICD-10-CM | POA: Diagnosis not present

## 2019-06-20 DIAGNOSIS — F411 Generalized anxiety disorder: Secondary | ICD-10-CM | POA: Diagnosis not present

## 2019-06-20 DIAGNOSIS — M5431 Sciatica, right side: Secondary | ICD-10-CM | POA: Diagnosis not present

## 2019-06-20 DIAGNOSIS — G4700 Insomnia, unspecified: Secondary | ICD-10-CM | POA: Diagnosis not present

## 2019-06-21 DIAGNOSIS — M5431 Sciatica, right side: Secondary | ICD-10-CM | POA: Diagnosis not present

## 2019-06-21 DIAGNOSIS — M9905 Segmental and somatic dysfunction of pelvic region: Secondary | ICD-10-CM | POA: Diagnosis not present

## 2019-06-21 DIAGNOSIS — M9903 Segmental and somatic dysfunction of lumbar region: Secondary | ICD-10-CM | POA: Diagnosis not present

## 2019-06-21 DIAGNOSIS — M9902 Segmental and somatic dysfunction of thoracic region: Secondary | ICD-10-CM | POA: Diagnosis not present

## 2019-06-28 DIAGNOSIS — M5431 Sciatica, right side: Secondary | ICD-10-CM | POA: Diagnosis not present

## 2019-07-06 DIAGNOSIS — M9903 Segmental and somatic dysfunction of lumbar region: Secondary | ICD-10-CM | POA: Diagnosis not present

## 2019-07-06 DIAGNOSIS — M5431 Sciatica, right side: Secondary | ICD-10-CM | POA: Diagnosis not present

## 2019-07-06 DIAGNOSIS — M9902 Segmental and somatic dysfunction of thoracic region: Secondary | ICD-10-CM | POA: Diagnosis not present

## 2019-07-06 DIAGNOSIS — M9905 Segmental and somatic dysfunction of pelvic region: Secondary | ICD-10-CM | POA: Diagnosis not present

## 2019-07-20 DIAGNOSIS — M5416 Radiculopathy, lumbar region: Secondary | ICD-10-CM | POA: Diagnosis not present

## 2019-07-21 DIAGNOSIS — M9902 Segmental and somatic dysfunction of thoracic region: Secondary | ICD-10-CM | POA: Diagnosis not present

## 2019-07-21 DIAGNOSIS — M9903 Segmental and somatic dysfunction of lumbar region: Secondary | ICD-10-CM | POA: Diagnosis not present

## 2019-07-21 DIAGNOSIS — M5431 Sciatica, right side: Secondary | ICD-10-CM | POA: Diagnosis not present

## 2019-07-21 DIAGNOSIS — M9905 Segmental and somatic dysfunction of pelvic region: Secondary | ICD-10-CM | POA: Diagnosis not present

## 2019-08-03 ENCOUNTER — Other Ambulatory Visit: Payer: Self-pay | Admitting: Neurological Surgery

## 2019-08-03 DIAGNOSIS — M545 Low back pain: Secondary | ICD-10-CM | POA: Diagnosis not present

## 2019-08-03 DIAGNOSIS — M4316 Spondylolisthesis, lumbar region: Secondary | ICD-10-CM | POA: Diagnosis not present

## 2019-08-03 DIAGNOSIS — M5416 Radiculopathy, lumbar region: Secondary | ICD-10-CM | POA: Diagnosis not present

## 2019-08-04 ENCOUNTER — Ambulatory Visit
Admission: RE | Admit: 2019-08-04 | Discharge: 2019-08-04 | Disposition: A | Discharge status: Home / Self Care | Payer: BC Managed Care – PPO | Source: Ambulatory Visit | Attending: Neurological Surgery | Admitting: Neurological Surgery

## 2019-08-04 ENCOUNTER — Other Ambulatory Visit: Payer: Self-pay

## 2019-08-04 DIAGNOSIS — M5116 Intervertebral disc disorders with radiculopathy, lumbar region: Secondary | ICD-10-CM | POA: Diagnosis not present

## 2019-08-04 DIAGNOSIS — M5416 Radiculopathy, lumbar region: Secondary | ICD-10-CM

## 2019-08-04 MED ORDER — IOPAMIDOL (ISOVUE-M 200) INJECTION 41%
1.0000 mL | Freq: Once | INTRAMUSCULAR | Status: AC
  Administered 2019-08-04: 1 mL via EPIDURAL

## 2019-08-04 MED ORDER — METHYLPREDNISOLONE ACETATE 40 MG/ML INJ SUSP (RADIOLOG
120.0000 mg | Freq: Once | INTRAMUSCULAR | Status: AC
  Administered 2019-08-04: 120 mg via EPIDURAL

## 2019-08-04 NOTE — Discharge Instructions (Signed)

## 2019-08-10 ENCOUNTER — Other Ambulatory Visit: Payer: Self-pay | Admitting: Neurological Surgery

## 2019-08-10 DIAGNOSIS — M5416 Radiculopathy, lumbar region: Secondary | ICD-10-CM | POA: Diagnosis not present

## 2019-08-23 ENCOUNTER — Ambulatory Visit
Admission: RE | Admit: 2019-08-23 | Discharge: 2019-08-23 | Disposition: A | Discharge status: Home / Self Care | Payer: BC Managed Care – PPO | Source: Ambulatory Visit | Attending: Neurological Surgery | Admitting: Neurological Surgery

## 2019-08-23 DIAGNOSIS — M5416 Radiculopathy, lumbar region: Secondary | ICD-10-CM

## 2019-08-23 DIAGNOSIS — M48061 Spinal stenosis, lumbar region without neurogenic claudication: Secondary | ICD-10-CM | POA: Diagnosis not present

## 2019-08-23 MED ORDER — METHYLPREDNISOLONE ACETATE 40 MG/ML INJ SUSP (RADIOLOG
120.0000 mg | Freq: Once | INTRAMUSCULAR | Status: AC
  Administered 2019-08-23: 120 mg via EPIDURAL

## 2019-08-23 MED ORDER — IOPAMIDOL (ISOVUE-M 200) INJECTION 41%
1.0000 mL | Freq: Once | INTRAMUSCULAR | Status: AC
  Administered 2019-08-23: 1 mL via EPIDURAL

## 2019-08-23 NOTE — Discharge Instructions (Signed)

## 2019-08-30 ENCOUNTER — Other Ambulatory Visit: Payer: BC Managed Care – PPO

## 2019-09-06 ENCOUNTER — Other Ambulatory Visit: Payer: Self-pay | Admitting: Neurological Surgery

## 2019-09-06 DIAGNOSIS — M4310 Spondylolisthesis, site unspecified: Secondary | ICD-10-CM | POA: Diagnosis not present

## 2019-09-06 DIAGNOSIS — M5116 Intervertebral disc disorders with radiculopathy, lumbar region: Secondary | ICD-10-CM | POA: Diagnosis not present

## 2019-09-06 DIAGNOSIS — M25559 Pain in unspecified hip: Secondary | ICD-10-CM | POA: Diagnosis not present

## 2019-09-06 DIAGNOSIS — M48061 Spinal stenosis, lumbar region without neurogenic claudication: Secondary | ICD-10-CM | POA: Diagnosis not present

## 2019-09-11 NOTE — Progress Notes (Signed)
No Pharmacies Listed    Your procedure is scheduled on Tuesday, July 6th.  Report to [facility] Main Entrance "A" at 5:30 A.M., and check in at the Admitting office.  Call this number if you have problems the morning of surgery:  [phone]  Call [phone] if you have any questions prior to your surgery date Monday-Friday 8am-4pm   Remember:  Do not eat or drink after midnight the night before your surgery   Take these medicines the morning of surgery with A SIP OF WATER   If needed - acetaminophen (TYLENOL), HYDROcodone-acetaminophen (NORCO), promethazine (PHENERGAN)    As of today, STOP taking any Aspirin (unless otherwise instructed by your surgeon) and Aspirin containing products, Aleve, Naproxen, Ibuprofen, Motrin, Advil, Goody's, BC's, all herbal medications, fish oil, and all vitamins.                     Do not wear jewelry, make up, or nail polish            Do not wear lotions, powders, perfumes, or deodorant.            Do not shave 48 hours prior to surgery.              Do not bring valuables to the hospital.            [facility] is not responsible for any belongings or valuables.  Do NOT Smoke (Tobacco/Vapping) or drink Alcohol 24 hours prior to your procedure If you use a CPAP at night, you may bring all equipment for your overnight stay.   Contacts, glasses, dentures or bridgework may not be worn into surgery.      For patients admitted to the hospital, discharge time will be determined by your treatment team.   Patients discharged the day of surgery will not be allowed to drive home, and someone needs to stay with them for 24 hours.  Special instructions:   [facility]- Preparing For Surgery  Before surgery, you can play an important role. Because skin is not sterile, your skin needs to be as free of germs as possible. You can reduce the number of germs on your skin by washing with CHG (chlorahexidine gluconate) Soap before surgery.  CHG is an antiseptic  cleaner which kills germs and bonds with the skin to continue killing germs even after washing.    Oral Hygiene is also important to reduce your risk of infection.  Remember - BRUSH YOUR TEETH THE MORNING OF SURGERY WITH YOUR REGULAR TOOTHPASTE  Please do not use if you have an allergy to CHG or antibacterial soaps. If your skin becomes reddened/irritated stop using the CHG.  Do not shave (including legs and underarms) for at least 48 hours prior to first CHG shower. It is OK to shave your face.  Please follow these instructions carefully.   1. Shower the NIGHT BEFORE SURGERY and the MORNING OF SURGERY with CHG Soap.   2. If you chose to wash your hair, wash your hair first as usual with your normal shampoo.  3. After you shampoo, rinse your hair and body thoroughly to remove the shampoo.  4. Use CHG as you would any other liquid soap. You can apply CHG directly to the skin and wash gently with a scrungie or a clean washcloth.   5. Apply the CHG Soap to your body ONLY FROM THE NECK DOWN.  Do not use on open wounds or open sores. Avoid contact with  your eyes, ears, mouth and genitals (private parts). Wash Face and genitals (private parts)  with your normal soap.   6. Wash thoroughly, paying special attention to the area where your surgery will be performed.  7. Thoroughly rinse your body with warm water from the neck down.  8. DO NOT shower/wash with your normal soap after using and rinsing off the CHG Soap.  9. Pat yourself dry with a CLEAN TOWEL.  10. Wear CLEAN PAJAMAS to bed the night before surgery, wear comfortable clothes the morning of surgery  11. Place CLEAN SHEETS on your bed the night of your first shower and DO NOT SLEEP WITH PETS.  Day of Surgery: Shower with CHG soap as instructed above.  Do not apply any deodorants/lotions.  Please wear clean clothes to the hospital/surgery center.   Remember to brush your teeth WITH YOUR REGULAR TOOTHPASTE.   Please read over the  following fact sheets that you were given.

## 2019-09-12 ENCOUNTER — Encounter (HOSPITAL_COMMUNITY)
Admission: RE | Admit: 2019-09-12 | Discharge: 2019-09-12 | Disposition: A | Discharge status: Home / Self Care | Payer: BC Managed Care – PPO | Source: Ambulatory Visit | Attending: Neurological Surgery | Admitting: Neurological Surgery

## 2019-09-12 ENCOUNTER — Other Ambulatory Visit: Payer: Self-pay

## 2019-09-12 ENCOUNTER — Encounter (HOSPITAL_COMMUNITY): Payer: Self-pay

## 2019-09-12 DIAGNOSIS — Z01812 Encounter for preprocedural laboratory examination: Secondary | ICD-10-CM | POA: Insufficient documentation

## 2019-09-12 LAB — CBC
HCT: 42 % (ref 36.0–46.0)
Hemoglobin: 13.2 g/dL (ref 12.0–15.0)
MCH: 31.6 pg (ref 26.0–34.0)
MCHC: 31.4 g/dL (ref 30.0–36.0)
MCV: 100.5 fL — ABNORMAL HIGH (ref 80.0–100.0)
Platelets: 228 10*3/uL (ref 150–400)
RBC: 4.18 MIL/uL (ref 3.87–5.11)
RDW: 13.3 % (ref 11.5–15.5)
WBC: 6 10*3/uL (ref 4.0–10.5)
nRBC: 0 % (ref 0.0–0.2)

## 2019-09-12 LAB — BASIC METABOLIC PANEL
Anion gap: 8 (ref 5–15)
BUN: 12 mg/dL (ref 6–20)
CO2: 29 mmol/L (ref 22–32)
Calcium: 9.5 mg/dL (ref 8.9–10.3)
Chloride: 103 mmol/L (ref 98–111)
Creatinine, Ser: 0.84 mg/dL (ref 0.44–1.00)
GFR calc Af Amer: >60 mL/min (ref >60)
GFR calc non Af Amer: >60 mL/min (ref >60)
Glucose, Bld: 84 mg/dL (ref 70–99)
Potassium: 4 mmol/L (ref 3.5–5.1)
Sodium: 140 mmol/L (ref 135–145)

## 2019-09-12 LAB — TYPE AND SCREEN
ABO/RH(D): O POS
Antibody Screen: NEGATIVE

## 2019-09-12 LAB — SURGICAL PCR SCREEN
MRSA, PCR: NEGATIVE
Staphylococcus aureus: NEGATIVE

## 2019-09-12 NOTE — Progress Notes (Signed)
[id number] - [place], [place] - [address] AT [address] [place] [address] Phone: [phone] Fax: [phone]     Your procedure is scheduled on Tuesday, July 6th.  Report to [facility] Main Entrance "A" at 5:30 A.M., and check in at the Admitting office.  Call this number if you have problems the morning of surgery:  [phone]  Call [phone] if you have any questions prior to your surgery date Monday-Friday 8am-4pm   Remember:  Do not eat or drink after midnight the night before your surgery   Take these medicines the morning of surgery with A SIP OF WATER  Lumify eye drops Gabapentin (Neurontin) Sertraline (Zoloft) Acetaminophen (Tylenol)- if needed Alprazolam (Xanax)- if needed   As of today, STOP taking any Aspirin (unless otherwise instructed by your surgeon) and Aspirin containing products, Aleve, Naproxen, Ibuprofen, Motrin, Advil, Goody's, BC's, all herbal medications, fish oil, and all vitamins.                     Do not wear jewelry, make up, or nail polish            Do not wear lotions, powders, perfumes, or deodorant.            Do not shave 48 hours prior to surgery.              Do not bring valuables to the hospital.            [facility] is not responsible for any belongings or valuables.  Do NOT Smoke (Tobacco/Vapping) or drink Alcohol 24 hours prior to your procedure If you use a CPAP at night, you may bring all equipment for your overnight stay.   Contacts, glasses, dentures or bridgework may not be worn into surgery.      For patients admitted to the hospital, discharge time will be determined by your treatment team.   Patients discharged the day of surgery will not be allowed to drive home, and someone needs to stay with them for 24 hours.  Special instructions:   - Preparing For Surgery  Before surgery, you can play an important role. Because skin is not  sterile, your skin needs to be as free of germs as possible. You can reduce the number of germs on your skin by washing with CHG (chlorahexidine gluconate) Soap before surgery.  CHG is an antiseptic cleaner which kills germs and bonds with the skin to continue killing germs even after washing.    Oral Hygiene is also important to reduce your risk of infection.  Remember - BRUSH YOUR TEETH THE MORNING OF SURGERY WITH YOUR REGULAR TOOTHPASTE  Please do not use if you have an allergy to CHG or antibacterial soaps. If your skin becomes reddened/irritated stop using the CHG.  Do not shave (including legs and underarms) for at least 48 hours prior to first CHG shower. It is OK to shave your face.  Please follow these instructions carefully.   1. Shower the NIGHT BEFORE SURGERY and the MORNING OF SURGERY with CHG Soap.   2. If you chose to wash your hair, wash your hair first as usual with your normal shampoo.  3. After you shampoo, rinse your hair and body thoroughly to remove the shampoo.  4. Use CHG as you would any other liquid soap. You can apply CHG directly to the skin and wash gently with a scrungie or a  clean washcloth.   5. Apply the CHG Soap to your body ONLY FROM THE NECK DOWN.  Do not use on open wounds or open sores. Avoid contact with your eyes, ears, mouth and genitals (private parts). Wash Face and genitals (private parts)  with your normal soap.   6. Wash thoroughly, paying special attention to the area where your surgery will be performed.  7. Thoroughly rinse your body with warm water from the neck down.  8. DO NOT shower/wash with your normal soap after using and rinsing off the CHG Soap.  9. Pat yourself dry with a CLEAN TOWEL.  10. Wear CLEAN PAJAMAS to bed the night before surgery, wear comfortable clothes the morning of surgery  11. Place CLEAN SHEETS on your bed the night of your first shower and DO NOT SLEEP WITH PETS.  Day of Surgery: Shower with CHG soap as  instructed above.  Do not apply any deodorants/lotions.  Please wear clean clothes to the hospital/surgery center.   Remember to brush your teeth WITH YOUR REGULAR TOOTHPASTE.   Please read over the following fact sheets that you were given.

## 2019-09-12 NOTE — Progress Notes (Signed)
PCP - Dr. Maryelizabeth Rowan Cardiologist - denies  Chest x-ray - N/A EKG - N/A Stress Test - denies ECHO - denies Cardiac Cath - denies  Sleep Study - denies  Aspirin Instructions: Patient instructed to hold all Aspirin, NSAID's, herbal medications, fish oil and vitamins 7 days prior to surgery.  Covid testing- to be done DOS. Drive thru closed 7/5, patient will be out of town 7/3.  Anesthesia review: N/A  Patient denies shortness of breath, fever, cough and chest pain at PAT appointment   Patient verbalized understanding of instructions that were given to them at the PAT appointment. Patient was also instructed that they will need to review over the PAT instructions again at home before surgery.

## 2019-09-21 DIAGNOSIS — M4316 Spondylolisthesis, lumbar region: Secondary | ICD-10-CM | POA: Diagnosis not present

## 2019-09-21 DIAGNOSIS — Z6825 Body mass index (BMI) 25.0-25.9, adult: Secondary | ICD-10-CM | POA: Diagnosis not present

## 2019-09-26 ENCOUNTER — Inpatient Hospital Stay (HOSPITAL_COMMUNITY): Payer: BC Managed Care – PPO | Admitting: Anesthesiology

## 2019-09-26 ENCOUNTER — Other Ambulatory Visit: Payer: Self-pay

## 2019-09-26 ENCOUNTER — Encounter (HOSPITAL_COMMUNITY)
Admission: RE | Disposition: A | Discharge status: Home / Self Care | Payer: Self-pay | Source: Home / Self Care | Attending: Neurological Surgery

## 2019-09-26 ENCOUNTER — Inpatient Hospital Stay (HOSPITAL_COMMUNITY): Payer: BC Managed Care – PPO

## 2019-09-26 ENCOUNTER — Inpatient Hospital Stay (HOSPITAL_COMMUNITY)
Admission: RE | Admit: 2019-09-26 | Discharge: 2019-09-27 | DRG: 460 | Disposition: A | Discharge status: Home / Self Care | Payer: BC Managed Care – PPO | Attending: Neurological Surgery | Admitting: Neurological Surgery

## 2019-09-26 ENCOUNTER — Encounter (HOSPITAL_COMMUNITY): Payer: Self-pay | Admitting: Neurological Surgery

## 2019-09-26 DIAGNOSIS — M4316 Spondylolisthesis, lumbar region: Secondary | ICD-10-CM | POA: Diagnosis not present

## 2019-09-26 DIAGNOSIS — Y838 Other surgical procedures as the cause of abnormal reaction of the patient, or of later complication, without mention of misadventure at the time of the procedure: Secondary | ICD-10-CM | POA: Diagnosis not present

## 2019-09-26 DIAGNOSIS — M488X6 Other specified spondylopathies, lumbar region: Secondary | ICD-10-CM | POA: Diagnosis not present

## 2019-09-26 DIAGNOSIS — M48061 Spinal stenosis, lumbar region without neurogenic claudication: Secondary | ICD-10-CM | POA: Diagnosis present

## 2019-09-26 DIAGNOSIS — G9609 Other spinal cerebrospinal fluid leak: Secondary | ICD-10-CM | POA: Diagnosis not present

## 2019-09-26 DIAGNOSIS — Z20822 Contact with and (suspected) exposure to covid-19: Secondary | ICD-10-CM | POA: Diagnosis present

## 2019-09-26 DIAGNOSIS — Y92234 Operating room of hospital as the place of occurrence of the external cause: Secondary | ICD-10-CM | POA: Diagnosis not present

## 2019-09-26 DIAGNOSIS — M5416 Radiculopathy, lumbar region: Secondary | ICD-10-CM | POA: Diagnosis present

## 2019-09-26 DIAGNOSIS — M4326 Fusion of spine, lumbar region: Secondary | ICD-10-CM | POA: Diagnosis not present

## 2019-09-26 DIAGNOSIS — Z419 Encounter for procedure for purposes other than remedying health state, unspecified: Secondary | ICD-10-CM

## 2019-09-26 HISTORY — PX: TRANSFORAMINAL LUMBAR INTERBODY FUSION W/ MIS 1 LEVEL: SHX6145

## 2019-09-26 LAB — ABO/RH: ABO/RH(D): O POS

## 2019-09-26 LAB — SARS CORONAVIRUS 2 BY RT PCR (HOSPITAL ORDER, PERFORMED IN ~~LOC~~ HOSPITAL LAB): SARS Coronavirus 2: NEGATIVE

## 2019-09-26 SURGERY — MINIMALLY INVASIVE (MIS) TRANSFORAMINAL LUMBAR INTERBODY FUSION (TLIF) 1 LEVEL
Anesthesia: General

## 2019-09-26 MED ORDER — OXYCODONE HCL 5 MG PO TABS
10.0000 mg | ORAL_TABLET | ORAL | Status: DC | PRN
  Administered 2019-09-26 – 2019-09-27 (×6): 10 mg via ORAL
  Filled 2019-09-26 (×6): qty 2

## 2019-09-26 MED ORDER — HEMOSTATIC AGENTS (NO CHARGE) OPTIME
TOPICAL | Status: DC | PRN
  Administered 2019-09-26 (×2): 1 via TOPICAL

## 2019-09-26 MED ORDER — GABAPENTIN 300 MG PO CAPS
600.0000 mg | ORAL_CAPSULE | Freq: Three times a day (TID) | ORAL | Status: DC
  Administered 2019-09-26 – 2019-09-27 (×3): 600 mg via ORAL
  Filled 2019-09-26 (×3): qty 2

## 2019-09-26 MED ORDER — LIDOCAINE-EPINEPHRINE 1 %-1:100000 IJ SOLN
INTRAMUSCULAR | Status: DC | PRN
  Administered 2019-09-26: 10 mL

## 2019-09-26 MED ORDER — FENTANYL CITRATE (PF) 250 MCG/5ML IJ SOLN
INTRAMUSCULAR | Status: AC
  Filled 2019-09-26: qty 5

## 2019-09-26 MED ORDER — OXYCODONE HCL 5 MG PO TABS
5.0000 mg | ORAL_TABLET | ORAL | Status: DC | PRN
  Administered 2019-09-26: 5 mg via ORAL

## 2019-09-26 MED ORDER — FENTANYL CITRATE (PF) 100 MCG/2ML IJ SOLN
INTRAMUSCULAR | Status: DC | PRN
  Administered 2019-09-26: 25 ug via INTRAVENOUS
  Administered 2019-09-26: 100 ug via INTRAVENOUS
  Administered 2019-09-26: 25 ug via INTRAVENOUS
  Administered 2019-09-26: 50 ug via INTRAVENOUS

## 2019-09-26 MED ORDER — OXYCODONE HCL 5 MG PO TABS
ORAL_TABLET | ORAL | Status: AC
  Filled 2019-09-26: qty 1

## 2019-09-26 MED ORDER — SODIUM CHLORIDE 0.9% FLUSH
3.0000 mL | Freq: Two times a day (BID) | INTRAVENOUS | Status: DC
  Administered 2019-09-26 (×2): 3 mL via INTRAVENOUS

## 2019-09-26 MED ORDER — POLYETHYLENE GLYCOL 3350 17 G PO PACK: 17.0000 g | PACK | Freq: Every day | ORAL | Status: DC | PRN

## 2019-09-26 MED ORDER — MIDAZOLAM HCL 2 MG/2ML IJ SOLN
INTRAMUSCULAR | Status: AC
  Filled 2019-09-26: qty 2

## 2019-09-26 MED ORDER — ACETAMINOPHEN 500 MG PO TABS
1000.0000 mg | ORAL_TABLET | Freq: Once | ORAL | Status: AC
  Administered 2019-09-26: 1000 mg via ORAL
  Filled 2019-09-26: qty 2

## 2019-09-26 MED ORDER — CHLORHEXIDINE GLUCONATE 0.12 % MT SOLN
15.0000 mL | Freq: Once | OROMUCOSAL | Status: DC
  Filled 2019-09-26: qty 15

## 2019-09-26 MED ORDER — EPHEDRINE SULFATE 50 MG/ML IJ SOLN
INTRAMUSCULAR | Status: DC | PRN
  Administered 2019-09-26 (×2): 10 mg via INTRAVENOUS

## 2019-09-26 MED ORDER — SODIUM CHLORIDE 0.9 % IV SOLN
INTRAVENOUS | Status: DC | PRN
  Administered 2019-09-26: 500 mL

## 2019-09-26 MED ORDER — CEFAZOLIN SODIUM-DEXTROSE 2-4 GM/100ML-% IV SOLN
2.0000 g | INTRAVENOUS | Status: AC
  Administered 2019-09-26 (×2): 2 g via INTRAVENOUS
  Filled 2019-09-26: qty 100

## 2019-09-26 MED ORDER — LIDOCAINE HCL (CARDIAC) PF 100 MG/5ML IV SOSY
PREFILLED_SYRINGE | INTRAVENOUS | Status: DC | PRN
  Administered 2019-09-26: 60 mg via INTRAVENOUS

## 2019-09-26 MED ORDER — MENTHOL 3 MG MT LOZG: 1.0000 | LOZENGE | OROMUCOSAL | Status: DC | PRN

## 2019-09-26 MED ORDER — SODIUM CHLORIDE 0.9% FLUSH: 3.0000 mL | INTRAVENOUS | Status: DC | PRN

## 2019-09-26 MED ORDER — MIDAZOLAM HCL 5 MG/5ML IJ SOLN
INTRAMUSCULAR | Status: DC | PRN
  Administered 2019-09-26: 2 mg via INTRAVENOUS

## 2019-09-26 MED ORDER — ROCURONIUM BROMIDE 100 MG/10ML IV SOLN
INTRAVENOUS | Status: DC | PRN
  Administered 2019-09-26 (×2): 50 mg via INTRAVENOUS
  Administered 2019-09-26: 100 mg via INTRAVENOUS

## 2019-09-26 MED ORDER — LIDOCAINE 2% (20 MG/ML) 5 ML SYRINGE
INTRAMUSCULAR | Status: AC
  Filled 2019-09-26: qty 5

## 2019-09-26 MED ORDER — CYCLOBENZAPRINE HCL 10 MG PO TABS
ORAL_TABLET | ORAL | Status: AC
  Filled 2019-09-26: qty 1

## 2019-09-26 MED ORDER — THROMBIN 5000 UNITS EX SOLR
OROMUCOSAL | Status: DC | PRN
  Administered 2019-09-26: 5 mL via TOPICAL

## 2019-09-26 MED ORDER — DOCUSATE SODIUM 100 MG PO CAPS
100.0000 mg | ORAL_CAPSULE | Freq: Two times a day (BID) | ORAL | Status: DC
  Administered 2019-09-26 – 2019-09-27 (×3): 100 mg via ORAL
  Filled 2019-09-26 (×3): qty 1

## 2019-09-26 MED ORDER — PHENYLEPHRINE 40 MCG/ML (10ML) SYRINGE FOR IV PUSH (FOR BLOOD PRESSURE SUPPORT)
PREFILLED_SYRINGE | INTRAVENOUS | Status: AC
  Filled 2019-09-26: qty 10

## 2019-09-26 MED ORDER — DEXAMETHASONE SODIUM PHOSPHATE 10 MG/ML IJ SOLN
INTRAMUSCULAR | Status: DC | PRN
  Administered 2019-09-26: 10 mg via INTRAVENOUS

## 2019-09-26 MED ORDER — PHENYLEPHRINE HCL-NACL 10-0.9 MG/250ML-% IV SOLN
INTRAVENOUS | Status: DC | PRN
  Administered 2019-09-26: 10 ug/min via INTRAVENOUS

## 2019-09-26 MED ORDER — LACTATED RINGERS IV SOLN: INTRAVENOUS | Status: DC | PRN

## 2019-09-26 MED ORDER — CHLORHEXIDINE GLUCONATE CLOTH 2 % EX PADS: 6.0000 | MEDICATED_PAD | Freq: Once | CUTANEOUS | Status: DC

## 2019-09-26 MED ORDER — SODIUM CHLORIDE 0.9 % IV SOLN
250.0000 mL | INTRAVENOUS | Status: DC
  Administered 2019-09-26: 250 mL via INTRAVENOUS

## 2019-09-26 MED ORDER — BRIMONIDINE TARTRATE 0.025 % OP SOLN: 1.0000 [drp] | Freq: Every day | OPHTHALMIC | Status: DC

## 2019-09-26 MED ORDER — SUGAMMADEX SODIUM 200 MG/2ML IV SOLN
INTRAVENOUS | Status: DC | PRN
  Administered 2019-09-26: 300 mg via INTRAVENOUS

## 2019-09-26 MED ORDER — PHENOL 1.4 % MT LIQD: 1.0000 | OROMUCOSAL | Status: DC | PRN

## 2019-09-26 MED ORDER — THROMBIN 5000 UNITS EX SOLR
CUTANEOUS | Status: AC
  Filled 2019-09-26: qty 5000

## 2019-09-26 MED ORDER — PROPOFOL 10 MG/ML IV BOLUS
INTRAVENOUS | Status: DC | PRN
  Administered 2019-09-26: 150 mg via INTRAVENOUS

## 2019-09-26 MED ORDER — FENTANYL CITRATE (PF) 100 MCG/2ML IJ SOLN
INTRAMUSCULAR | Status: AC
  Filled 2019-09-26: qty 2

## 2019-09-26 MED ORDER — CEFAZOLIN SODIUM-DEXTROSE 2-4 GM/100ML-% IV SOLN
2.0000 g | Freq: Three times a day (TID) | INTRAVENOUS | Status: AC
  Administered 2019-09-26 (×2): 2 g via INTRAVENOUS
  Filled 2019-09-26 (×2): qty 100

## 2019-09-26 MED ORDER — SERTRALINE HCL 50 MG PO TABS: 100.0000 mg | ORAL_TABLET | Freq: Every day | ORAL | Status: DC

## 2019-09-26 MED ORDER — ONDANSETRON HCL 4 MG PO TABS: 4.0000 mg | ORAL_TABLET | Freq: Four times a day (QID) | ORAL | Status: DC | PRN

## 2019-09-26 MED ORDER — ORAL CARE MOUTH RINSE: 15.0000 mL | Freq: Once | OROMUCOSAL | Status: DC

## 2019-09-26 MED ORDER — PHENYLEPHRINE HCL (PRESSORS) 10 MG/ML IV SOLN
INTRAVENOUS | Status: DC | PRN
  Administered 2019-09-26: 80 ug via INTRAVENOUS
  Administered 2019-09-26: 160 ug via INTRAVENOUS
  Administered 2019-09-26 (×2): 80 ug via INTRAVENOUS

## 2019-09-26 MED ORDER — HYDROMORPHONE HCL 1 MG/ML IJ SOLN
1.0000 mg | INTRAMUSCULAR | Status: DC | PRN
  Administered 2019-09-26: 1 mg via INTRAVENOUS
  Filled 2019-09-26: qty 1

## 2019-09-26 MED ORDER — EPHEDRINE 5 MG/ML INJ
INTRAVENOUS | Status: AC
  Filled 2019-09-26: qty 10

## 2019-09-26 MED ORDER — PROPOFOL 10 MG/ML IV BOLUS
INTRAVENOUS | Status: AC
  Filled 2019-09-26: qty 20

## 2019-09-26 MED ORDER — ALPRAZOLAM 0.5 MG PO TABS
2.0000 mg | ORAL_TABLET | Freq: Every day | ORAL | Status: DC
  Administered 2019-09-26: 2 mg via ORAL
  Filled 2019-09-26: qty 4

## 2019-09-26 MED ORDER — LIDOCAINE-EPINEPHRINE 1 %-1:100000 IJ SOLN
INTRAMUSCULAR | Status: AC
  Filled 2019-09-26: qty 1

## 2019-09-26 MED ORDER — ROCURONIUM BROMIDE 10 MG/ML (PF) SYRINGE
PREFILLED_SYRINGE | INTRAVENOUS | Status: AC
  Filled 2019-09-26: qty 10

## 2019-09-26 MED ORDER — ACETAMINOPHEN 650 MG RE SUPP: 650.0000 mg | RECTAL | Status: DC | PRN

## 2019-09-26 MED ORDER — DEXAMETHASONE SODIUM PHOSPHATE 10 MG/ML IJ SOLN
INTRAMUSCULAR | Status: AC
  Filled 2019-09-26: qty 1

## 2019-09-26 MED ORDER — ONDANSETRON HCL 4 MG/2ML IJ SOLN: 4.0000 mg | Freq: Four times a day (QID) | INTRAMUSCULAR | Status: DC | PRN

## 2019-09-26 MED ORDER — CYCLOBENZAPRINE HCL 10 MG PO TABS
10.0000 mg | ORAL_TABLET | Freq: Three times a day (TID) | ORAL | Status: DC | PRN
  Administered 2019-09-26: 10 mg via ORAL

## 2019-09-26 MED ORDER — 0.9 % SODIUM CHLORIDE (POUR BTL) OPTIME
TOPICAL | Status: DC | PRN
  Administered 2019-09-26: 1000 mL

## 2019-09-26 MED ORDER — NAPHAZOLINE-PHENIRAMINE 0.025-0.3 % OP SOLN
1.0000 [drp] | Freq: Every day | OPHTHALMIC | Status: DC
  Filled 2019-09-26: qty 15

## 2019-09-26 MED ORDER — ONDANSETRON HCL 4 MG/2ML IJ SOLN
INTRAMUSCULAR | Status: AC
  Filled 2019-09-26: qty 2

## 2019-09-26 MED ORDER — NITROGLYCERIN IN D5W 200-5 MCG/ML-% IV SOLN
INTRAVENOUS | Status: AC
  Filled 2019-09-26: qty 250

## 2019-09-26 MED ORDER — FENTANYL CITRATE (PF) 100 MCG/2ML IJ SOLN
25.0000 ug | INTRAMUSCULAR | Status: DC | PRN
  Administered 2019-09-26: 50 ug via INTRAVENOUS

## 2019-09-26 MED ORDER — ACETAMINOPHEN 325 MG PO TABS
650.0000 mg | ORAL_TABLET | ORAL | Status: DC | PRN
  Administered 2019-09-26 – 2019-09-27 (×2): 650 mg via ORAL
  Filled 2019-09-26 (×2): qty 2

## 2019-09-26 SURGICAL SUPPLY — 67 items
BAG DECANTER FOR FLEXI CONT (MISCELLANEOUS) ×2 IMPLANT
BAND RUBBER #18 3X1/16 STRL (MISCELLANEOUS) ×4 IMPLANT
BASKET BONE COLLECTION (BASKET) ×2 IMPLANT
BLADE CLIPPER SURG (BLADE) ×2 IMPLANT
BLADE SURG 11 STRL SS (BLADE) ×2 IMPLANT
BUR 2.5 MTCH HD 16 (BUR) ×2 IMPLANT
BUR MATCHSTICK NEURO 3.0 LAGG (BURR) IMPLANT
BUR PRECISION FLUTE 5.0 (BURR) IMPLANT
BUR PRECISION MATCH 3.0 13 (BURR) ×2 IMPLANT
CNTNR URN SCR LID CUP LEK RST (MISCELLANEOUS) ×1 IMPLANT
CONT SPEC 4OZ STRL OR WHT (MISCELLANEOUS) ×1
COVER BACK TABLE 60X90IN (DRAPES) ×2 IMPLANT
COVER WAND RF STERILE (DRAPES) ×2 IMPLANT
DECANTER SPIKE VIAL GLASS SM (MISCELLANEOUS) ×2 IMPLANT
DERMABOND ADVANCED (GAUZE/BANDAGES/DRESSINGS) ×1
DERMABOND ADVANCED .7 DNX12 (GAUZE/BANDAGES/DRESSINGS) ×1 IMPLANT
DEVICE INTERBODY ELEVATE 23X7 (Cage) ×2 IMPLANT
DRAPE C-ARM 42X72 X-RAY (DRAPES) ×2 IMPLANT
DRAPE C-ARMOR (DRAPES) ×2 IMPLANT
DRAPE LAPAROTOMY 100X72X124 (DRAPES) ×2 IMPLANT
DRAPE MICROSCOPE LEICA (MISCELLANEOUS) ×2 IMPLANT
DRAPE SURG 17X23 STRL (DRAPES) ×4 IMPLANT
ELECT BLADE 6.5 EXT (BLADE) ×2 IMPLANT
ELECT REM PT RETURN 9FT ADLT (ELECTROSURGICAL) ×2
ELECTRODE REM PT RTRN 9FT ADLT (ELECTROSURGICAL) ×1 IMPLANT
EXTENDER TAB GUIDE SV 5.5/6.0 (INSTRUMENTS) ×16 IMPLANT
GAUZE 4X4 16PLY RFD (DISPOSABLE) IMPLANT
GAUZE SPONGE 4X4 12PLY STRL (GAUZE/BANDAGES/DRESSINGS) ×2 IMPLANT
GLOVE BIO SURGEON STRL SZ 6.5 (GLOVE) ×8 IMPLANT
GLOVE BIO SURGEON STRL SZ7.5 (GLOVE) ×2 IMPLANT
GLOVE BIOGEL PI IND STRL 7.5 (GLOVE) ×1 IMPLANT
GLOVE BIOGEL PI INDICATOR 7.5 (GLOVE) ×1
GLOVE ECLIPSE 9.0 STRL (GLOVE) ×2 IMPLANT
GLOVE SURG SS PI 7.0 STRL IVOR (GLOVE) ×2 IMPLANT
GLOVE SURG SS PI 8.0 STRL IVOR (GLOVE) ×2 IMPLANT
GOWN STRL REUS W/ TWL LRG LVL3 (GOWN DISPOSABLE) ×3 IMPLANT
GOWN STRL REUS W/ TWL XL LVL3 (GOWN DISPOSABLE) ×1 IMPLANT
GOWN STRL REUS W/TWL 2XL LVL3 (GOWN DISPOSABLE) IMPLANT
GOWN STRL REUS W/TWL LRG LVL3 (GOWN DISPOSABLE) ×3
GOWN STRL REUS W/TWL XL LVL3 (GOWN DISPOSABLE) ×1
GUIDEWIRE BLUNT NT 450 (WIRE) ×8 IMPLANT
HEMOSTAT POWDER KIT SURGIFOAM (HEMOSTASIS) ×2 IMPLANT
KIT BASIN OR (CUSTOM PROCEDURE TRAY) ×2 IMPLANT
KIT POSITION SURG JACKSON T1 (MISCELLANEOUS) ×2 IMPLANT
KIT TURNOVER KIT B (KITS) IMPLANT
NEEDLE BEVEL TWO-PAK W/1PK (NEEDLE) ×2 IMPLANT
NEEDLE HYPO 18GX1.5 BLUNT FILL (NEEDLE) IMPLANT
NEEDLE HYPO 22GX1.5 SAFETY (NEEDLE) ×2 IMPLANT
NEEDLE SPNL 18GX3.5 QUINCKE PK (NEEDLE) IMPLANT
NS IRRIG 1000ML POUR BTL (IV SOLUTION) ×2 IMPLANT
PACK LAMINECTOMY NEURO (CUSTOM PROCEDURE TRAY) ×2 IMPLANT
PAD ARMBOARD 7.5X6 YLW CONV (MISCELLANEOUS) IMPLANT
ROD 5.5X45MM SOLERA VOYAGER (Rod) ×4 IMPLANT
SCREW 6.5X35 VOYAGER MAS FNS (Screw) ×8 IMPLANT
SCREW SET 5.5/6.0MM SOLERA (Screw) ×8 IMPLANT
SEALANT ADHERUS EXTEND TIP (MISCELLANEOUS) ×4 IMPLANT
SPONGE LAP 4X18 RFD (DISPOSABLE) IMPLANT
SUT MNCRL AB 3-0 PS2 18 (SUTURE) ×2 IMPLANT
SUT VIC AB 0 CT1 18XCR BRD8 (SUTURE) ×1 IMPLANT
SUT VIC AB 0 CT1 8-18 (SUTURE) ×1
SUT VIC AB 2-0 CP2 18 (SUTURE) ×2 IMPLANT
SYR 3ML LL SCALE MARK (SYRINGE) IMPLANT
TOWEL GREEN STERILE (TOWEL DISPOSABLE) ×2 IMPLANT
TOWEL GREEN STERILE FF (TOWEL DISPOSABLE) ×2 IMPLANT
TRAY FOLEY MTR SLVR 16FR STAT (SET/KITS/TRAYS/PACK) ×2 IMPLANT
WATER STERILE IRR 1000ML POUR (IV SOLUTION) ×2 IMPLANT
voyager extender medtronic (Screw) ×16 IMPLANT

## 2019-09-26 NOTE — Brief Op Note (Signed)
09/26/2019  12:27 PM  PATIENT:  Melissa Stephenson  59 y.o. female  PRE-OPERATIVE DIAGNOSIS:  Spondylolisthesis, Lumbar region  POST-OPERATIVE DIAGNOSIS:  Spondylolisthesis, Lumbar region  PROCEDURE:  Procedure(s): Lumbar four- five Minimally invasive Transforaminal lumbar interbody fusion (N/A)  SURGEON:  Surgeon(s) and Role:    * Jamorion Gomillion, Clovis Pu, MD - Primary    * Julio Sicks, MD - Assisting  PHYSICIAN ASSISTANT:   ANESTHESIA:   general  EBL:  260 mL   BLOOD ADMINISTERED:none  DRAINS: none   LOCAL MEDICATIONS USED:  LIDOCAINE   SPECIMEN:  No Specimen  DISPOSITION OF SPECIMEN:  N/A  COUNTS:  YES  TOURNIQUET:  * No tourniquets in log *  DICTATION: .Note written in EPIC  PLAN OF CARE: Admit for overnight observation  PATIENT DISPOSITION:  PACU - hemodynamically stable.   Delay start of Pharmacological VTE agent (>24hrs) due to surgical blood loss or risk of bleeding: yes

## 2019-09-26 NOTE — Consult Note (Deleted)
Surgical H&P Update  HPI: 59 y.o. woman with right greater than left lower extremity radicular pain and back pain. MRI and flex-ex films showed mobile spondylolisthesis at L4-5 with right greater than left lateral recess stenosis. Of note, my numbering uses final fully formed disc space. No changes in health since she was last seen. Still having symptoms and wishes to proceed with surgery.  PMHx:  Past Medical History:  Diagnosis Date  . Anxiety   . Insomnia   . Sinus trouble    FamHx: History reviewed. No pertinent family history. SocHx:  reports that she has never smoked. She has never used smokeless tobacco. She reports current alcohol use. She reports that she does not use drugs.  Physical Exam: AOx3, PERRL, FS, TM  Strength 5/5 x4, SILTx4  Assesment/Plan: 59 y.o. woman with right greater than left lower extremity radiculopathy and low back pain 2/2 mobile spondylolisthesis, here for right L4-5 TLIF. Risks, benefits, and alternatives discussed and the patient would like to continue with surgery.  -OR today -3C post-op  Daneya Hartgrove A Ha Shannahan, MD 09/26/19 7:21 AM   

## 2019-09-26 NOTE — Op Note (Signed)
PATIENT: Melissa Stephenson  DAY OF SURGERY: 09/26/19   PRE-OPERATIVE DIAGNOSIS:  Lumbar radiculopathy with mobile lumbar spondylolisthesis   POST-OPERATIVE DIAGNOSIS:  Same   PROCEDURE:  Right L4-L5 minimally invasive transforaminal lumbar interbody fusion with bilateral L4-L5 pedicle screw placement   SURGEON:  Surgeon(s) and Role:    Jadene Pierini, MD - Primary    Julio Sicks, MD - Assisting   ANESTHESIA: ETGA   BRIEF HISTORY: This is a 59 year old woman who presented with right greater than leg pain and low back pain. The patient was found to have a mobile spondylolisthesis at L4-5 with lateral recess stenosis on the right greater than left. Of note, my numbering uses the last fully formed disc space, the operative level was the one with the significant spondylolisthesis. This was discussed with the patient as well as risks, benefits, and alternatives and wished to proceed with surgery.   OPERATIVE DETAIL:  The patient was taken to the operating room, anesthesia was induced by the anesthesia team and she was placed on the OR table in the prone position. A formal time out was performed with two patient identifiers and confirmed the operative site. The operative site was marked, hair was clipped with surgical clippers, the area was then prepped and draped in a sterile fashion.   Fluoroscopy was used to localize the surgical level. The pedicles were marked and used to create skin incisions bilaterally. With fluoro guidance, Jamshidi needles were used to guide K-wires into the bilateral L4 and L5 pedicles. The K wires were then secured with hemostats and attention turned to the TLIF.  A [employer] tube was then docked to the right L4-5 facet through the same incision using fluoroscopy. The facet blocked docking of a large enough tube for the TLIF, so I docked an 74mm tube and performed a partial facetectomy, then docked a 44mm tube, which then docked through the fascia and muscle. A right L4-L5  facetectomy was then performed and the right L4 nerve root was decompressed along its entire course. The tube was wanded medially and the decompression was continued medially until reaching the contralateral foramen. The ligamentum flavum was first removed medially, using the defect from midline. While removing medial LF, I noticed CSF leaking up from the lateral recess.  I therefore extended the facetectomy laterally above LF and created a ligamentous defect to approach the dural defect. There was a ventrolateral defect that was covered in calcified ligament that must have created a defect while removing the medial LF. The leak stopped with application of a cottonoid and I opted to leave a layer of ligament over this section instead of trying to remove it and extend the defect.  The tube was wanded back to the disc space. The disc space was identified, incised, and a discectomy was performed in the standard fashion. The endplates were prepped, bone graft was packed into the disc space, and an expandable cage ([employer]) was packed with autograft and placed into the disc space with fluoroscopic confirmation. After hemostasis was obtained, Adheris ([employer]) dural sealant was applied due to the prior CSF leak. The tube was removed and hemostasis was obtained during its removal.   Using the previously placed K wires, a tap and then screw with tower were placed bilaterally at L4 and L5. A rod was sized and introduced on both sides, confirmed with fluoroscopy, then final tightened. Hemostasis was again confirmed for both incisions, they were copiously irrigated, and then closed in layers.  EBL:    DRAINS: none   SPECIMENS: none   Jadene Pierini, MD 09/26/19 7:30 AM

## 2019-09-26 NOTE — Anesthesia Preprocedure Evaluation (Addendum)
Anesthesia Evaluation  Patient identified by MRN, date of birth, ID band Patient awake    Reviewed: Allergy & Precautions, NPO status , Patient's Chart, lab work & pertinent test results  Airway Mallampati: II  TM Distance: >3 FB Neck ROM: Full    Dental no notable dental hx. (+) Teeth Intact, Dental Advisory Given   Pulmonary neg pulmonary ROS,    Pulmonary exam normal breath sounds clear to auscultation       Cardiovascular negative cardio ROS Normal cardiovascular exam Rhythm:Regular Rate:Normal     Neuro/Psych PSYCHIATRIC DISORDERS Anxiety negative neurological ROS     GI/Hepatic negative GI ROS, Neg liver ROS,   Endo/Other  negative endocrine ROS  Renal/GU negative Renal ROS  negative genitourinary   Musculoskeletal negative musculoskeletal ROS (+)   Abdominal   Peds  Hematology negative hematology ROS (+)   Anesthesia Other Findings   Reproductive/Obstetrics                            Anesthesia Physical Anesthesia Plan  ASA: II  Anesthesia Plan: General   Post-op Pain Management:    Induction: Intravenous  PONV Risk Score and Plan: 3 and Midazolam, Dexamethasone and Ondansetron  Airway Management Planned: Oral ETT  Additional Equipment:   Intra-op Plan:   Post-operative Plan: Extubation in OR  Informed Consent: I have reviewed the patients History and Physical, chart, labs and discussed the procedure including the risks, benefits and alternatives for the proposed anesthesia with the patient or authorized representative who has indicated his/her understanding and acceptance.     Dental advisory given  Plan Discussed with: CRNA  Anesthesia Plan Comments:         Anesthesia Quick Evaluation

## 2019-09-26 NOTE — Transfer of Care (Signed)
Immediate Anesthesia Transfer of Care Note  Patient: Melissa Stephenson  Procedure(s) Performed: Lumbar four- five Minimally invasive Transforaminal lumbar interbody fusion (N/A )  Patient Location: PACU  Anesthesia Type:General  Level of Consciousness: drowsy  Airway & Oxygen Therapy: Patient Spontanous Breathing and Patient connected to nasal cannula oxygen  Post-op Assessment: Report given to RN, Post -op Vital signs reviewed and stable and Patient moving all extremities  Post vital signs: Reviewed and stable  Last Vitals:  Vitals Value Taken Time  BP 117/55 09/26/19 1250  Temp    Pulse 103 09/26/19 1252  Resp 14 09/26/19 1252  SpO2 98 % 09/26/19 1252  Vitals shown include unvalidated device data.  Last Pain:  Vitals:   09/26/19 0640  TempSrc: Temporal  PainSc:       Patients Stated Pain Goal: 3 (09/26/19 0618)  Complications: No complications documented.

## 2019-09-26 NOTE — Anesthesia Postprocedure Evaluation (Signed)
Anesthesia Post Note  Patient: Melissa Stephenson  Procedure(s) Performed: Lumbar four- five Minimally invasive Transforaminal lumbar interbody fusion (N/A )     Patient location during evaluation: PACU Anesthesia Type: General Level of consciousness: awake and alert Pain management: pain level controlled Vital Signs Assessment: post-procedure vital signs reviewed and stable Respiratory status: spontaneous breathing, nonlabored ventilation, respiratory function stable and patient connected to nasal cannula oxygen Cardiovascular status: blood pressure returned to baseline and stable Postop Assessment: no apparent nausea or vomiting Anesthetic complications: no   No complications documented.  Last Vitals:  Vitals:   09/26/19 1315 09/26/19 1409  BP: (!) 114/55 114/69  Pulse: 92 86  Resp: 15 16  Temp:  36.4 C  SpO2: 94% 97%    Last Pain:  Vitals:   09/26/19 1409  TempSrc: Oral  PainSc:                  Elgie Landino L Kerri Kovacik

## 2019-09-26 NOTE — Anesthesia Procedure Notes (Signed)
Procedure Name: Intubation Date/Time: 09/26/2019 8:03 AM Performed by: Lottie Siska T, CRNA Pre-anesthesia Checklist: Patient identified, Emergency Drugs available, Suction available and Patient being monitored Patient Re-evaluated:Patient Re-evaluated prior to induction Oxygen Delivery Method: Circle system utilized Preoxygenation: Pre-oxygenation with 100% oxygen Induction Type: IV induction Ventilation: Mask ventilation without difficulty Laryngoscope Size: Miller and 3 Grade View: Grade I Tube type: Oral Tube size: 7.5 mm Number of attempts: 1 Airway Equipment and Method: Stylet and Oral airway Placement Confirmation: ETT inserted through vocal cords under direct vision,  positive ETCO2 and breath sounds checked- equal and bilateral Secured at: 22 cm Tube secured with: Tape Dental Injury: Teeth and Oropharynx as per pre-operative assessment

## 2019-09-26 NOTE — H&P (Signed)
Surgical H&P Update  HPI: 59 y.o. woman with right greater than left lower extremity radicular pain and back pain. MRI and flex-ex films showed mobile spondylolisthesis at L4-5 with right greater than left lateral recess stenosis. Of note, my numbering uses final fully formed disc space. No changes in health since she was last seen. Still having symptoms and wishes to proceed with surgery.  PMHx:  Past Medical History:  Diagnosis Date  . Anxiety   . Insomnia   . Sinus trouble    FamHx: History reviewed. No pertinent family history. SocHx:  reports that she has never smoked. She has never used smokeless tobacco. She reports current alcohol use. She reports that she does not use drugs.  Physical Exam: AOx3, PERRL, FS, TM  Strength 5/5 x4, SILTx4  Assesment/Plan: 59 y.o. woman with right greater than left lower extremity radiculopathy and low back pain 2/2 mobile spondylolisthesis, here for right L4-5 TLIF. Risks, benefits, and alternatives discussed and the patient would like to continue with surgery.  -OR today -3C post-op  Jadene Pierini, MD 09/26/19 7:21 AM

## 2019-09-27 MED ORDER — HYDROCODONE-ACETAMINOPHEN 5-325 MG PO TABS: 1.0000 | ORAL_TABLET | ORAL | 0 refills | Status: AC | PRN

## 2019-09-27 NOTE — Evaluation (Signed)
Physical Therapy Evaluation and Discharge Patient Details Name: Melissa Stephenson MRN: [id number] DOB: 1961-03-03 Today's Date: 09/27/2019   History of Present Illness  59 y/o female s/p L4-5 TLIF. PMH includes anxiety, insomnia.  Clinical Impression  Patient evaluated by Physical Therapy with no further acute PT needs identified. All education has been completed and the patient has no further questions. Pt was able to demonstrate transfers and ambulation with gross modified independence. Pt appears very cautious, especially in regards to doing stairs at home. She asked many questions regarding sleeping on the couch as opposed to her bed upstairs. She did well with stair training and will have family present to assist her if needed. I encouraged her to sleep in the bed if she is able to tolerate it. Pt was educated on precautions, appropriate activity progression, and car transfer. See below for any follow-up Physical Therapy or equipment needs. PT is signing off. Thank you for this referral.     Follow Up Recommendations No PT follow up;Supervision - Intermittent    Equipment Recommendations  None recommended by PT    Recommendations for Other Services       Precautions / Restrictions Precautions Precautions: Back Precaution Booklet Issued: Yes (comment) Precaution Comments: Pt was cued for precautions during functional mobility.  Required Braces or Orthoses:  (No brace needed order) Restrictions Weight Bearing Restrictions: No Other Position/Activity Restrictions: no brace needed per orders      Mobility  Bed Mobility Overal bed mobility: Modified Independent             General bed mobility comments: VC's for optimal log roll technique.   Transfers Overall transfer level: Modified independent Equipment used: None             General transfer comment: VC's for improved posture. Pt was able to power-up to full stand without assistance and with good maintenance of  precautions  Ambulation/Gait Ambulation/Gait assistance: Modified independent (Device/Increase time) Gait Distance (Feet): 300 Feet Assistive device: None Gait Pattern/deviations: Step-through pattern;Decreased stride length Gait velocity: Decreased Gait velocity interpretation: <1.8 ft/sec, indicate of risk for recurrent falls General Gait Details: Slow and guarded but without unsteadiness. Pt tolerated distance well without complaints of pain.   Stairs Stairs: Yes Stairs assistance: Supervision Stair Management: One rail Right;Step to pattern;Alternating pattern Number of Stairs: 10 General stair comments: VC's for sequencing and general safety. Progressed to alternating stairs while ascending, and descended with step-to pattern.   Wheelchair Mobility    Modified Rankin (Stroke Patients Only)       Balance Overall balance assessment: Modified Independent                                           Pertinent Vitals/Pain Pain Assessment: 0-10 Pain Score: 4  Pain Descriptors / Indicators: Discomfort Pain Intervention(s): Limited activity within patient's tolerance;Monitored during session;Repositioned    Home Living Family/patient expects to be discharged to:: Private residence Living Arrangements: Spouse/significant other Available Help at Discharge: Family;Available 24 hours/day Type of Home: House Home Access: Stairs to enter   [employer] of Steps: 5 Home Layout: Two level;Full bath on main level Home Equipment: None      Prior Function Level of Independence: Independent               Hand Dominance        Extremity/Trunk Assessment   Upper Extremity Assessment  Upper Extremity Assessment: Defer to OT evaluation    Lower Extremity Assessment Lower Extremity Assessment: Generalized weakness (Consistent with pre-op diagnosis)    Cervical / Trunk Assessment Cervical / Trunk Assessment: Other exceptions Cervical / Trunk  Exceptions: s/p surgery  Communication   Communication: No difficulties  Cognition Arousal/Alertness: Awake/alert Behavior During Therapy: WFL for tasks assessed/performed Overall Cognitive Status: Within Functional Limits for tasks assessed                                        General Comments      Exercises     Assessment/Plan    PT Assessment Patent does not need any further PT services  PT Problem List         PT Treatment Interventions      PT Goals (Current goals can be found in the Care Plan section)  Acute Rehab PT Goals Patient Stated Goal: return to work PT Goal Formulation: All assessment and education complete, DC therapy    Frequency     Barriers to discharge        Co-evaluation               AM-PAC PT "6 Clicks" Mobility  Outcome Measure Help needed turning from your back to your side while in a flat bed without using bedrails?: None Help needed moving from lying on your back to sitting on the side of a flat bed without using bedrails?: None Help needed moving to and from a bed to a chair (including a wheelchair)?: None Help needed standing up from a chair using your arms (e.g., wheelchair or bedside chair)?: None Help needed to walk in hospital room?: None Help needed climbing 3-5 steps with a railing? : None 6 Click Score: 24    End of Session Equipment Utilized During Treatment: Gait belt Activity Tolerance: Patient tolerated treatment well Patient left: in chair;with call bell/phone within reach;with nursing/sitter in room;Other (comment) (MD present) Nurse Communication: Mobility status PT Visit Diagnosis: Pain;Other symptoms and signs involving the nervous system (R29.898) Pain - part of body:  (back)    Time: 2130-8657 PT Time Calculation (min) (ACUTE ONLY): 22 min   Charges:   PT Evaluation $PT Eval Low Complexity: 1 Low          Conni Slipper, PT, DPT Acute Rehabilitation Services Pager:  [phone] Office: [phone]   Melissa Stephenson 09/27/2019, 10:34 AM

## 2019-09-27 NOTE — Discharge Instructions (Addendum)
Discharge Instructions  No restriction in activities, slowly increase your activity back to normal.   Your incision is closed with dermabond (purple glue). This will naturally fall off over the next 1-2 weeks.   Okay to shower on the day of discharge. Use regular soap and water and try to be gentle when cleaning your incision.   Activity  Walk each and every day, increasing distance each day.  No lifting greater than 5 lbs.  Avoid bending, arching, and twisting.  Call Your Doctor If Any of These Occur  Redness, drainage, or swelling at the wound.   Temperature greater than 101 degrees.  Severe pain not relieved by pain medication.  Incision starts to come apart.  Follow up with Dr. Maurice Small in 2 weeks after discharge. If you do not already have a discharge appointment, please call his office at [phone] to schedule a follow up appointment. If you have any concerns or questions, please call the office and let us know.

## 2019-09-27 NOTE — Progress Notes (Signed)
Neurosurgery Service Progress Note  Subjective: No acute events overnight, preop symptoms resolved    Objective: Vitals:   09/26/19 2012 09/26/19 2317 09/27/19 0416 09/27/19 0758  BP: (!) 107/57 (!) 100/50 (!) 91/51 (!) 99/50  Pulse: 88 66 70 70  Resp: 18 18 18 16   Temp: 98.3 F (36.8 C) 98.4 F (36.9 C) 98.6 F (37 C) 98 F (36.7 C)  TempSrc: Oral Oral Oral   SpO2: 95% 97% 96% 100%  Weight:      Height:       Temp (24hrs), Avg:98.2 F (36.8 C), Min:97.6 F (36.4 C), Max:98.6 F (37 C)  CBC Latest Ref Rng & Units 09/12/2019 01/18/2012  WBC 4.0 - 10.5 K/uL 6.0 -  Hemoglobin 12.0 - 15.0 g/dL 01/20/2012 75.1  Hematocrit 36 - 46 % 42.0 -  Platelets 150 - 400 K/uL 228 -   BMP Latest Ref Rng & Units 09/12/2019  Glucose 70 - 99 mg/dL 84  BUN 6 - 20 mg/dL 12  Creatinine 09/14/2019 - 1.74 mg/dL 9.44  Sodium 9.67 - 591 mmol/L 140  Potassium 3.5 - 5.1 mmol/L 4.0  Chloride 98 - 111 mmol/L 103  CO2 22 - 32 mmol/L 29  Calcium 8.9 - 10.3 mg/dL 9.5    Intake/Output Summary (Last 24 hours) at 09/27/2019 0838 Last data filed at 09/26/2019 1700 Gross per 24 hour  Intake 2040 ml  Output 910 ml  Net 1130 ml    Current Facility-Administered Medications:  .  0.9 %  sodium chloride infusion, 250 mL, Intravenous, Continuous, Corrigan Kretschmer A, MD, Last Rate: 1 mL/hr at 09/26/19 1440, 250 mL at 09/26/19 1440 .  acetaminophen (TYLENOL) tablet 650 mg, 650 mg, Oral, Q4H PRN, 650 mg at 09/27/19 0609 **OR** acetaminophen (TYLENOL) suppository 650 mg, 650 mg, Rectal, Q4H PRN, 0610, MD .  ALPRAZolam Jadene Pierini) tablet 2 mg, 2 mg, Oral, QHS, Dossie Swor, Prudy Feeler, MD, 2 mg at 09/26/19 2135 .  cyclobenzaprine (FLEXERIL) tablet 10 mg, 10 mg, Oral, TID PRN, 2136, MD, 10 mg at 09/26/19 1305 .  docusate sodium (COLACE) capsule 100 mg, 100 mg, Oral, BID, 11/27/19, MD, 100 mg at 09/26/19 2135 .  gabapentin (NEURONTIN) capsule 600 mg, 600 mg, Oral, TID, 2136, MD, 600  mg at 09/26/19 2135 .  HYDROmorphone (DILAUDID) injection 1 mg, 1 mg, Intravenous, Q3H PRN, 2136, MD, 1 mg at 09/26/19 1439 .  menthol-cetylpyridinium (CEPACOL) lozenge 3 mg, 1 lozenge, Oral, PRN **OR** phenol (CHLORASEPTIC) mouth spray 1 spray, 1 spray, Mouth/Throat, PRN, Alecxander Mainwaring, 11/27/19, MD .  naphazoline-pheniramine (NAPHCON-A) 0.025-0.3 % ophthalmic solution 1 drop, 1 drop, Both Eyes, Daily, Pierce, Dwayne A, RPH .  ondansetron (ZOFRAN) tablet 4 mg, 4 mg, Oral, Q6H PRN **OR** ondansetron (ZOFRAN) injection 4 mg, 4 mg, Intravenous, Q6H PRN, Alle Difabio A, MD .  oxyCODONE (Oxy IR/ROXICODONE) immediate release tablet 10 mg, 10 mg, Oral, Q4H PRN, Clovis Pu, MD, 10 mg at 09/27/19 0610 .  oxyCODONE (Oxy IR/ROXICODONE) immediate release tablet 5 mg, 5 mg, Oral, Q4H PRN, 11/28/19, MD, 5 mg at 09/26/19 1305 .  polyethylene glycol (MIRALAX / GLYCOLAX) packet 17 g, 17 g, Oral, Daily PRN, 11/27/19, MD .  sertraline (ZOLOFT) tablet 100 mg, 100 mg, Oral, Daily, Ermal Haberer A, MD .  sodium chloride flush (NS) 0.9 % injection 3 mL, 3 mL, Intravenous, Q12H, Azia Toutant, Jadene Pierini, MD, 3 mL at 09/26/19 2137 .  sodium chloride  flush (NS) 0.9 % injection 3 mL, 3 mL, Intravenous, PRN, Ruta Capece, Clovis Pu, MD   Physical Exam: AOx3, PERRL, EOMI, FS, Strength 5/5 x4, SILTx4 Incisions c/d/i x2  Assessment & Plan: 59 y.o. woman s/p MIS TLIF for mobil spondy, recovering well.  -discharge home today  Jadene Pierini  09/27/19 8:38 AM

## 2019-09-27 NOTE — Discharge Summary (Signed)
Discharge Summary  Date of Admission: 09/26/2019  Date of Discharge: 09/27/19  Attending Physician: Autumn Patty, MD  Hospital Course: Patient was admitted following an uncomplicated L4-5 TLIF. She was recovered in PACU and transferred to [facility]. Her preop symptoms completely resolved and she was very pleased post-op. Her hospital course was uncomplicated and the patient was discharged home on 09/27/19 (POD1). She will follow up in clinic with me in 2 weeks.  Neurologic exam at discharge:  AOx3, PERRL, EOMI, FS, TM Strength 5/5 x4, SILTx4  Discharge diagnosis: Lumbar spondylolisthesis  Jadene Pierini, MD 09/27/19 8:41 AM

## 2019-09-27 NOTE — Progress Notes (Signed)
Patient alert and oriented, mae's well, voiding adequate amount of urine, swallowing without difficulty, no c/o pain at time of discharge. Patient discharged home with family. Script and discharged instructions given to patient. Patient and family stated understanding of instructions given. Patient has an appointment with Dr. Ostergard in 2 weeks 

## 2019-09-27 NOTE — Evaluation (Signed)
Occupational Therapy Evaluation Patient Details Name: Melissa Stephenson MRN: [id number] DOB: 17-Oct-1960 Today's Date: 09/27/2019    History of Present Illness 59 y/o female s/p L4-5 TLIF. PMH includes anxiety, insomnia.   Clinical Impression   PTA pt independent, working as Engineer, materials. At time of eval, pt presents with ability to complete BADL at mod I level. Back handout provided and reviewed adls in detail. Pt educated on: clothing between brace, never sleep in brace, set an alarm at night for medication, avoid sitting for long periods of time, correct bed positioning for sleeping, correct sequence for bed mobility, avoiding lifting more than 5 pounds and never wash directly over incision. All education is complete and patient indicates understanding. No further OT needs identified, OT Will sign off. Thank you for this consult.     Follow Up Recommendations  No OT follow up    Equipment Recommendations  3 in 1 bedside commode    Recommendations for Other Services       Precautions / Restrictions Precautions Precautions: Back Precaution Booklet Issued: Yes (comment) Precaution Comments: reviewed precautions throughout pertaining to BADL/IADL routines Restrictions Weight Bearing Restrictions: No Other Position/Activity Restrictions: no brace needed per orders      Mobility Bed Mobility               General bed mobility comments: up in chair  Transfers Overall transfer level: Modified independent                    Balance Overall balance assessment: Modified Independent                                         ADL either performed or assessed with clinical judgement   ADL                                         General ADL Comments: Pt presents with ability to complete BADL at mod I level. Educated pt on appropriate precautions for typical BADL/IADL routine and which actions/activities to not do at this time.  Educated pt on [facility] over toilet and in shower as needed. Pt drives for a living, educated her to refrain from travelling until MD clears her for this.     Vision Patient Visual Report: No change from baseline       Perception     Praxis      Pertinent Vitals/Pain Pain Assessment: 0-10 Pain Score: 4  Pain Descriptors / Indicators: Discomfort Pain Intervention(s): Monitored during session;Repositioned     Hand Dominance     Extremity/Trunk Assessment Upper Extremity Assessment Upper Extremity Assessment: Overall WFL for tasks assessed   Lower Extremity Assessment Lower Extremity Assessment: Defer to PT evaluation;Overall WFL for tasks assessed       Communication Communication Communication: No difficulties   Cognition Arousal/Alertness: Awake/alert Behavior During Therapy: WFL for tasks assessed/performed Overall Cognitive Status: Within Functional Limits for tasks assessed                                     General Comments       Exercises     Shoulder Instructions      Home Living Family/patient expects to be  discharged to:: Private residence Living Arrangements: Spouse/significant other Available Help at Discharge: Family;Available 24 hours/day Type of Home: House Home Access: Stairs to enter [employer] of Steps: 5   Home Layout: Two level;Full bath on main level     Bathroom Shower/Tub: Producer, television/film/video: Standard     Home Equipment: None          Prior Functioning/Environment Level of Independence: Independent                 OT Problem List: Decreased knowledge of use of DME or AE;Decreased knowledge of precautions;Impaired balance (sitting and/or standing)      OT Treatment/Interventions:      OT Goals(Current goals can be found in the care plan section) Acute Rehab OT Goals Patient Stated Goal: return to work OT Goal Formulation: All assessment and education complete, DC therapy  OT  Frequency:     Barriers to D/C:            Co-evaluation              AM-PAC OT "6 Clicks" Daily Activity     Outcome Measure Help from another person eating meals?: None Help from another person taking care of personal grooming?: None Help from another person toileting, which includes using toliet, bedpan, or urinal?: None Help from another person bathing (including washing, rinsing, drying)?: None Help from another person to put on and taking off regular upper body clothing?: None Help from another person to put on and taking off regular lower body clothing?: None 6 Click Score: 24   End of Session Nurse Communication: Mobility status  Activity Tolerance: Patient tolerated treatment well Patient left: in chair;with call bell/phone within reach;with family/visitor present  OT Visit Diagnosis: Other abnormalities of gait and mobility (R26.89)                Time: 8786-7672 OT Time Calculation (min): 23 min Charges:  OT General Charges $OT Visit: 1 Visit OT Evaluation $OT Eval Low Complexity: 1 Low OT Treatments $Self Care/Home Management : 8-22 mins  Dalphine Handing, MSOT, OTR/L Acute Rehabilitation Services [place] Office Number: [phone] Pager: [phone]  Dalphine Handing 09/27/2019, 9:09 AM

## 2019-09-28 ENCOUNTER — Encounter (HOSPITAL_COMMUNITY): Payer: Self-pay | Admitting: Neurological Surgery

## 2019-10-17 DIAGNOSIS — F411 Generalized anxiety disorder: Secondary | ICD-10-CM | POA: Diagnosis not present

## 2019-10-18 DIAGNOSIS — M5416 Radiculopathy, lumbar region: Secondary | ICD-10-CM | POA: Diagnosis not present

## 2019-10-23 DIAGNOSIS — N3000 Acute cystitis without hematuria: Secondary | ICD-10-CM | POA: Diagnosis not present

## 2019-10-27 DIAGNOSIS — M545 Low back pain: Secondary | ICD-10-CM | POA: Diagnosis not present

## 2019-10-27 DIAGNOSIS — R2689 Other abnormalities of gait and mobility: Secondary | ICD-10-CM | POA: Diagnosis not present

## 2019-10-27 DIAGNOSIS — R531 Weakness: Secondary | ICD-10-CM | POA: Diagnosis not present

## 2019-10-30 DIAGNOSIS — R531 Weakness: Secondary | ICD-10-CM | POA: Diagnosis not present

## 2019-10-30 DIAGNOSIS — F411 Generalized anxiety disorder: Secondary | ICD-10-CM | POA: Diagnosis not present

## 2019-10-30 DIAGNOSIS — R2689 Other abnormalities of gait and mobility: Secondary | ICD-10-CM | POA: Diagnosis not present

## 2019-10-30 DIAGNOSIS — M545 Low back pain: Secondary | ICD-10-CM | POA: Diagnosis not present

## 2019-11-01 DIAGNOSIS — M545 Low back pain: Secondary | ICD-10-CM | POA: Diagnosis not present

## 2019-11-01 DIAGNOSIS — R2689 Other abnormalities of gait and mobility: Secondary | ICD-10-CM | POA: Diagnosis not present

## 2019-11-01 DIAGNOSIS — G4700 Insomnia, unspecified: Secondary | ICD-10-CM | POA: Diagnosis not present

## 2019-11-01 DIAGNOSIS — F411 Generalized anxiety disorder: Secondary | ICD-10-CM | POA: Diagnosis not present

## 2019-11-01 DIAGNOSIS — F331 Major depressive disorder, recurrent, moderate: Secondary | ICD-10-CM | POA: Diagnosis not present

## 2019-11-01 DIAGNOSIS — R531 Weakness: Secondary | ICD-10-CM | POA: Diagnosis not present

## 2019-11-03 DIAGNOSIS — R2689 Other abnormalities of gait and mobility: Secondary | ICD-10-CM | POA: Diagnosis not present

## 2019-11-03 DIAGNOSIS — R531 Weakness: Secondary | ICD-10-CM | POA: Diagnosis not present

## 2019-11-03 DIAGNOSIS — M545 Low back pain: Secondary | ICD-10-CM | POA: Diagnosis not present

## 2019-11-09 DIAGNOSIS — G4700 Insomnia, unspecified: Secondary | ICD-10-CM | POA: Diagnosis not present

## 2019-11-17 DIAGNOSIS — G4700 Insomnia, unspecified: Secondary | ICD-10-CM | POA: Diagnosis not present

## 2019-11-20 DIAGNOSIS — R531 Weakness: Secondary | ICD-10-CM | POA: Diagnosis not present

## 2019-11-20 DIAGNOSIS — M545 Low back pain: Secondary | ICD-10-CM | POA: Diagnosis not present

## 2019-11-20 DIAGNOSIS — R2689 Other abnormalities of gait and mobility: Secondary | ICD-10-CM | POA: Diagnosis not present

## 2019-11-28 DIAGNOSIS — M545 Low back pain: Secondary | ICD-10-CM | POA: Diagnosis not present

## 2019-11-28 DIAGNOSIS — R531 Weakness: Secondary | ICD-10-CM | POA: Diagnosis not present

## 2019-11-28 DIAGNOSIS — R2689 Other abnormalities of gait and mobility: Secondary | ICD-10-CM | POA: Diagnosis not present

## 2019-11-29 DIAGNOSIS — M545 Low back pain: Secondary | ICD-10-CM | POA: Diagnosis not present

## 2019-11-29 DIAGNOSIS — R2689 Other abnormalities of gait and mobility: Secondary | ICD-10-CM | POA: Diagnosis not present

## 2019-11-29 DIAGNOSIS — R531 Weakness: Secondary | ICD-10-CM | POA: Diagnosis not present

## 2019-12-01 DIAGNOSIS — R2689 Other abnormalities of gait and mobility: Secondary | ICD-10-CM | POA: Diagnosis not present

## 2019-12-01 DIAGNOSIS — R531 Weakness: Secondary | ICD-10-CM | POA: Diagnosis not present

## 2019-12-01 DIAGNOSIS — M545 Low back pain: Secondary | ICD-10-CM | POA: Diagnosis not present

## 2019-12-05 DIAGNOSIS — G4700 Insomnia, unspecified: Secondary | ICD-10-CM | POA: Diagnosis not present

## 2019-12-06 DIAGNOSIS — F411 Generalized anxiety disorder: Secondary | ICD-10-CM | POA: Diagnosis not present

## 2019-12-06 DIAGNOSIS — M4316 Spondylolisthesis, lumbar region: Secondary | ICD-10-CM | POA: Diagnosis not present

## 2020-01-18 DIAGNOSIS — F411 Generalized anxiety disorder: Secondary | ICD-10-CM | POA: Diagnosis not present

## 2020-01-23 DIAGNOSIS — F411 Generalized anxiety disorder: Secondary | ICD-10-CM | POA: Diagnosis not present

## 2020-01-29 DIAGNOSIS — F331 Major depressive disorder, recurrent, moderate: Secondary | ICD-10-CM | POA: Diagnosis not present

## 2020-01-29 DIAGNOSIS — G4700 Insomnia, unspecified: Secondary | ICD-10-CM | POA: Diagnosis not present

## 2020-01-29 DIAGNOSIS — F411 Generalized anxiety disorder: Secondary | ICD-10-CM | POA: Diagnosis not present

## 2020-01-30 DIAGNOSIS — F411 Generalized anxiety disorder: Secondary | ICD-10-CM | POA: Diagnosis not present

## 2020-01-30 DIAGNOSIS — Z23 Encounter for immunization: Secondary | ICD-10-CM | POA: Diagnosis not present

## 2020-02-14 DIAGNOSIS — F411 Generalized anxiety disorder: Secondary | ICD-10-CM | POA: Diagnosis not present

## 2020-02-21 DIAGNOSIS — F411 Generalized anxiety disorder: Secondary | ICD-10-CM | POA: Diagnosis not present

## 2020-02-21 DIAGNOSIS — F331 Major depressive disorder, recurrent, moderate: Secondary | ICD-10-CM | POA: Diagnosis not present

## 2020-02-27 DIAGNOSIS — H5212 Myopia, left eye: Secondary | ICD-10-CM | POA: Diagnosis not present

## 2020-03-06 DIAGNOSIS — F331 Major depressive disorder, recurrent, moderate: Secondary | ICD-10-CM | POA: Diagnosis not present

## 2020-03-06 DIAGNOSIS — F411 Generalized anxiety disorder: Secondary | ICD-10-CM | POA: Diagnosis not present

## 2020-03-14 DIAGNOSIS — Z1231 Encounter for screening mammogram for malignant neoplasm of breast: Secondary | ICD-10-CM | POA: Diagnosis not present

## 2020-03-14 DIAGNOSIS — Z6823 Body mass index (BMI) 23.0-23.9, adult: Secondary | ICD-10-CM | POA: Diagnosis not present

## 2020-03-14 DIAGNOSIS — Z20822 Contact with and (suspected) exposure to covid-19: Secondary | ICD-10-CM | POA: Diagnosis not present

## 2020-03-14 DIAGNOSIS — Z01419 Encounter for gynecological examination (general) (routine) without abnormal findings: Secondary | ICD-10-CM | POA: Diagnosis not present

## 2020-04-04 DIAGNOSIS — F411 Generalized anxiety disorder: Secondary | ICD-10-CM | POA: Diagnosis not present

## 2020-04-04 DIAGNOSIS — F331 Major depressive disorder, recurrent, moderate: Secondary | ICD-10-CM | POA: Diagnosis not present

## 2020-04-29 DIAGNOSIS — F411 Generalized anxiety disorder: Secondary | ICD-10-CM | POA: Diagnosis not present

## 2020-04-29 DIAGNOSIS — F331 Major depressive disorder, recurrent, moderate: Secondary | ICD-10-CM | POA: Diagnosis not present

## 2020-05-27 DIAGNOSIS — F331 Major depressive disorder, recurrent, moderate: Secondary | ICD-10-CM | POA: Diagnosis not present

## 2020-05-27 DIAGNOSIS — F411 Generalized anxiety disorder: Secondary | ICD-10-CM | POA: Diagnosis not present

## 2020-05-31 IMAGING — XA Imaging study
2 series · 2 of 2 positions shown · non-contrast
Comparison: none

CLINICAL DATA: Lumbar radiculopathy. Displacement of the L4-5
lumbar disc. Transitional anatomy. Right lower extremity
radiculopathy.

[Series 2: ortho standard · 1 of 1 slices shown (1 of 2)]
[im 1/1]
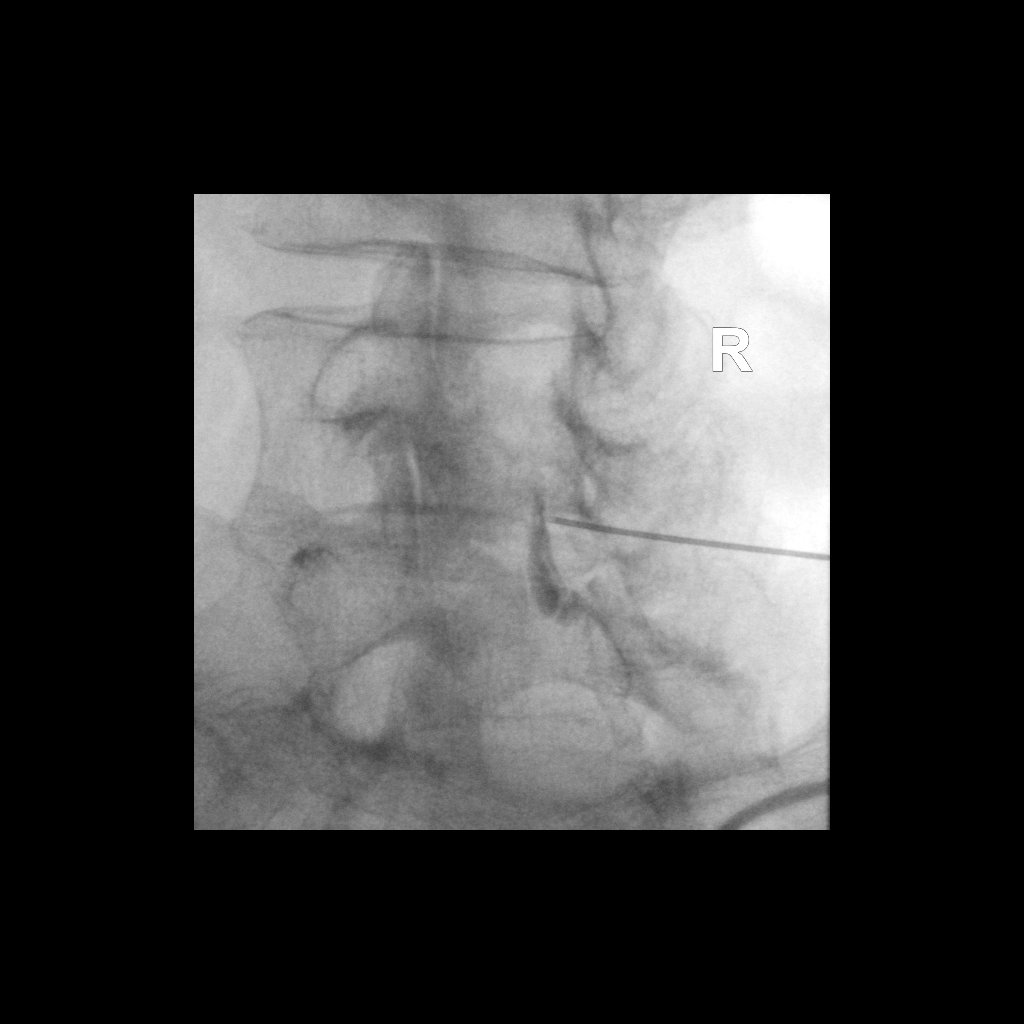

[Series 3: ortho standard · 1 of 1 slices shown (2 of 2)]
[im 1/1]
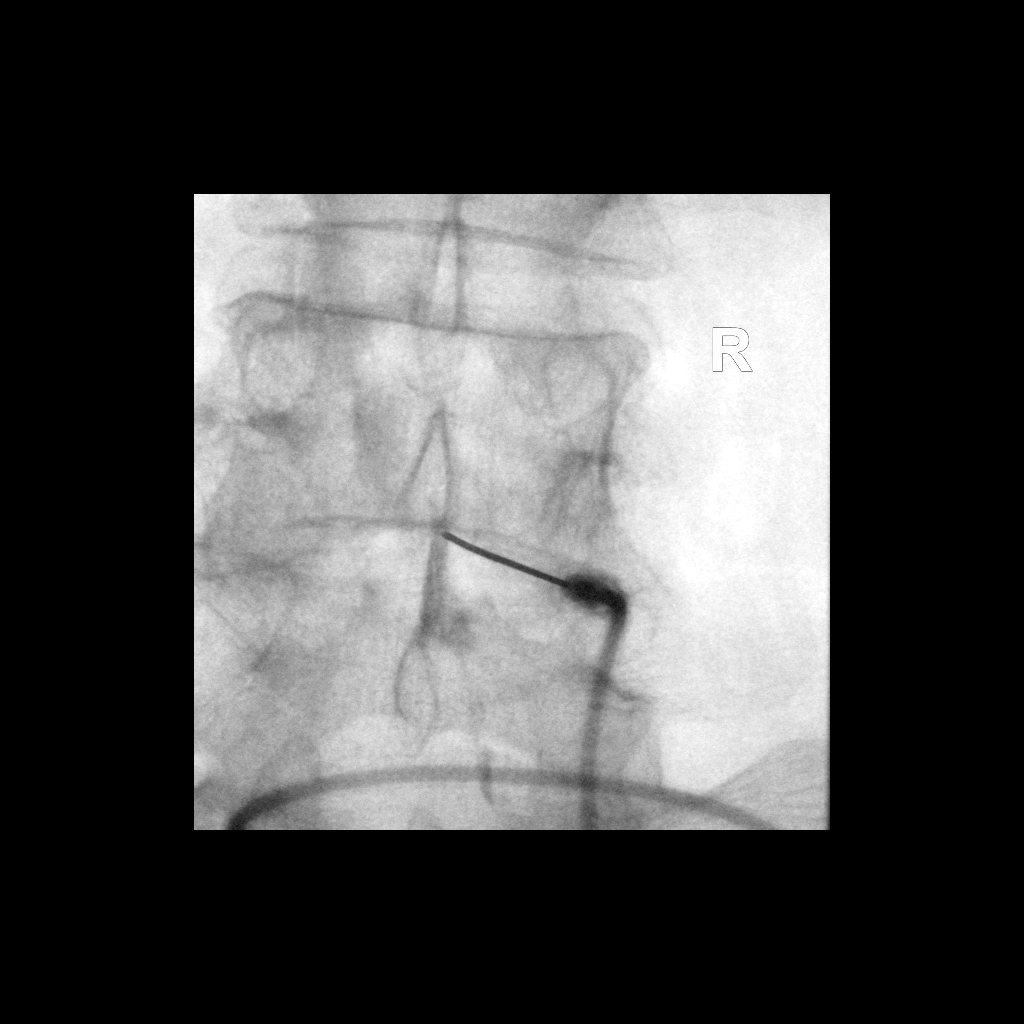

[2 of 2 positions shown; findings below may reference images not displayed]

FLUOROSCOPY TIME:  Radiation Exposure Index (as provided by the
fluoroscopic device): 74.04 uGy*m2

PROCEDURE:
The procedure, risks, benefits, and alternatives were explained to
the patient. Questions regarding the procedure were encouraged and
answered. The patient understands and consents to the procedure.

LUMBAR EPIDURAL INJECTION:

An interlaminar approach was performed on right at L4-5. The
overlying skin was cleansed and anesthetized. A 20 gauge epidural
needle was advanced using loss-of-resistance technique.

DIAGNOSTIC EPIDURAL INJECTION:

Injection of Isovue-M 200 shows a good epidural pattern with spread
above and below the level of needle placement, primarily on the
right no vascular opacification is seen.

THERAPEUTIC EPIDURAL INJECTION:

120 mg of Depo-Medrol mixed with 2 mL 1% lidocaine were instilled.
The procedure was well-tolerated, and the patient was discharged
thirty minutes following the injection in good condition.

COMPLICATIONS:
None
IMPRESSION: Technically successful epidural injection on the right L4-5 # 1

## 2020-06-10 DIAGNOSIS — F331 Major depressive disorder, recurrent, moderate: Secondary | ICD-10-CM | POA: Diagnosis not present

## 2020-06-10 DIAGNOSIS — F411 Generalized anxiety disorder: Secondary | ICD-10-CM | POA: Diagnosis not present

## 2020-06-12 DIAGNOSIS — F411 Generalized anxiety disorder: Secondary | ICD-10-CM | POA: Diagnosis not present

## 2020-06-12 DIAGNOSIS — F331 Major depressive disorder, recurrent, moderate: Secondary | ICD-10-CM | POA: Diagnosis not present

## 2020-06-19 IMAGING — XA Imaging study
2 series · 2 of 2 positions shown · non-contrast
Comparison: none

CLINICAL DATA: Lumbosacral spondylosis without myelopathy. Advanced
spinal canal stenosis at L4-L5 due to facet mediated
anterolisthesis. 25% relief after L4-L5 injection 2-3 weeks ago.

[Series 1: ortho adipose · 1 of 1 slices shown (1 of 2)]
[im 1/1]
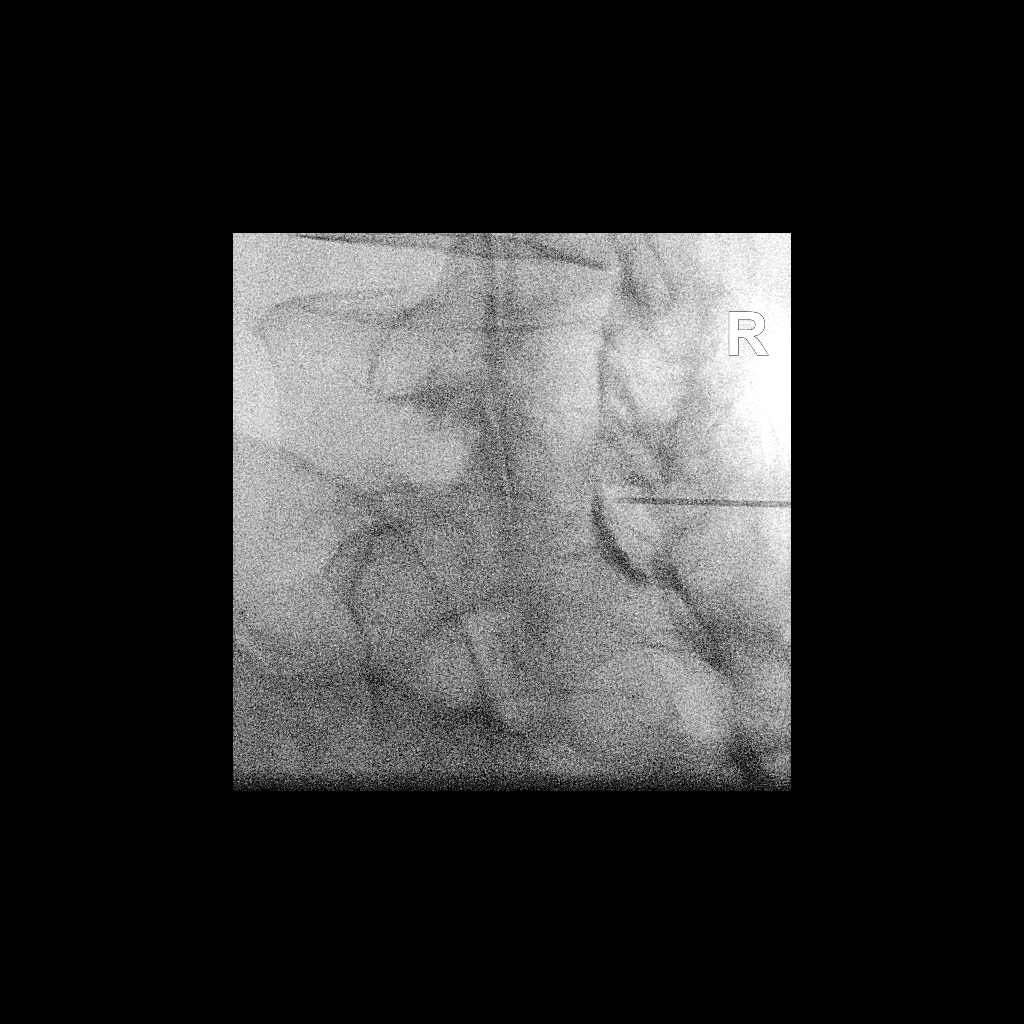

[Series 2: ortho adipose · 1 of 1 slices shown (2 of 2)]
[im 1/1]
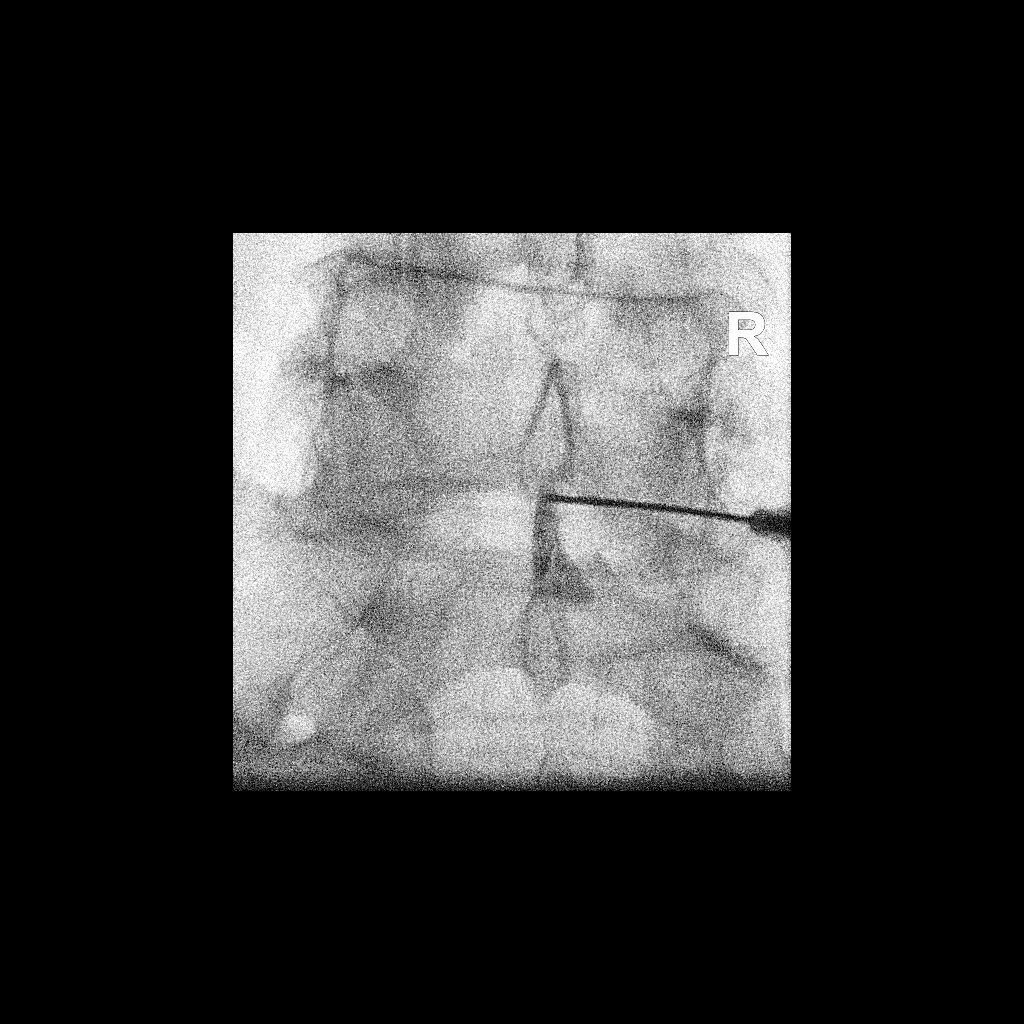

[2 of 2 positions shown; findings below may reference images not displayed]

FLUOROSCOPY TIME:  Radiation Exposure Index (as provided by the
fluoroscopic device): 1.0 mGy

Fluoroscopy Time:  13 seconds

Number of Acquired Images:  0

PROCEDURE:
The procedure, risks, benefits, and alternatives were explained to
the patient. Questions regarding the procedure were encouraged and
answered. The patient understands and consents to the procedure.

LUMBAR EPIDURAL INJECTION:

An interlaminar approach was performed on the right at L4-L5. The
overlying skin was cleansed and anesthetized. A 3.5 inch 20 gauge
epidural needle was advanced using loss-of-resistance technique.

DIAGNOSTIC EPIDURAL INJECTION:

Injection of Isovue-M 200 shows a good epidural pattern with spread
above and below the level of needle placement, primarily on the
right. No vascular opacification is seen.

THERAPEUTIC EPIDURAL INJECTION:

120 mg of Depo-Medrol mixed with 3 mL of 1% lidocaine were
instilled. The procedure was well-tolerated, and the patient was
discharged thirty minutes following the injection in good condition.

COMPLICATIONS:
None immediate.
IMPRESSION: Technically successful interlaminar epidural injection on the right
at L4-L5.

## 2020-07-02 DIAGNOSIS — E559 Vitamin D deficiency, unspecified: Secondary | ICD-10-CM | POA: Diagnosis not present

## 2020-07-02 DIAGNOSIS — Z0000 Encounter for general adult medical examination without abnormal findings: Secondary | ICD-10-CM | POA: Diagnosis not present

## 2020-07-02 DIAGNOSIS — R5383 Other fatigue: Secondary | ICD-10-CM | POA: Diagnosis not present

## 2020-07-02 DIAGNOSIS — N926 Irregular menstruation, unspecified: Secondary | ICD-10-CM | POA: Diagnosis not present

## 2020-07-05 DIAGNOSIS — N390 Urinary tract infection, site not specified: Secondary | ICD-10-CM | POA: Diagnosis not present

## 2020-07-05 DIAGNOSIS — Z0000 Encounter for general adult medical examination without abnormal findings: Secondary | ICD-10-CM | POA: Diagnosis not present

## 2020-07-05 DIAGNOSIS — N3000 Acute cystitis without hematuria: Secondary | ICD-10-CM | POA: Diagnosis not present

## 2020-07-05 DIAGNOSIS — Z1211 Encounter for screening for malignant neoplasm of colon: Secondary | ICD-10-CM | POA: Diagnosis not present

## 2020-07-05 DIAGNOSIS — B356 Tinea cruris: Secondary | ICD-10-CM | POA: Diagnosis not present

## 2020-07-26 DIAGNOSIS — Z23 Encounter for immunization: Secondary | ICD-10-CM | POA: Diagnosis not present

## 2020-09-04 DIAGNOSIS — G4700 Insomnia, unspecified: Secondary | ICD-10-CM | POA: Diagnosis not present

## 2020-09-05 DIAGNOSIS — M4316 Spondylolisthesis, lumbar region: Secondary | ICD-10-CM | POA: Diagnosis not present

## 2020-09-05 DIAGNOSIS — M5416 Radiculopathy, lumbar region: Secondary | ICD-10-CM | POA: Diagnosis not present

## 2020-09-09 DIAGNOSIS — F331 Major depressive disorder, recurrent, moderate: Secondary | ICD-10-CM | POA: Diagnosis not present

## 2020-09-09 DIAGNOSIS — F411 Generalized anxiety disorder: Secondary | ICD-10-CM | POA: Diagnosis not present

## 2020-10-21 DIAGNOSIS — F411 Generalized anxiety disorder: Secondary | ICD-10-CM | POA: Diagnosis not present

## 2020-10-21 DIAGNOSIS — F332 Major depressive disorder, recurrent severe without psychotic features: Secondary | ICD-10-CM | POA: Diagnosis not present

## 2020-10-22 DIAGNOSIS — L570 Actinic keratosis: Secondary | ICD-10-CM | POA: Diagnosis not present

## 2020-10-22 DIAGNOSIS — D235 Other benign neoplasm of skin of trunk: Secondary | ICD-10-CM | POA: Diagnosis not present

## 2020-10-22 DIAGNOSIS — Z08 Encounter for follow-up examination after completed treatment for malignant neoplasm: Secondary | ICD-10-CM | POA: Diagnosis not present

## 2020-10-22 DIAGNOSIS — Z85828 Personal history of other malignant neoplasm of skin: Secondary | ICD-10-CM | POA: Diagnosis not present

## 2020-10-22 DIAGNOSIS — D2361 Other benign neoplasm of skin of right upper limb, including shoulder: Secondary | ICD-10-CM | POA: Diagnosis not present

## 2020-10-22 DIAGNOSIS — D485 Neoplasm of uncertain behavior of skin: Secondary | ICD-10-CM | POA: Diagnosis not present

## 2020-10-23 DIAGNOSIS — F332 Major depressive disorder, recurrent severe without psychotic features: Secondary | ICD-10-CM | POA: Diagnosis not present

## 2020-10-23 DIAGNOSIS — F411 Generalized anxiety disorder: Secondary | ICD-10-CM | POA: Diagnosis not present

## 2020-11-11 DIAGNOSIS — R32 Unspecified urinary incontinence: Secondary | ICD-10-CM | POA: Diagnosis not present

## 2020-11-11 DIAGNOSIS — F418 Other specified anxiety disorders: Secondary | ICD-10-CM | POA: Diagnosis not present

## 2020-11-12 DIAGNOSIS — F332 Major depressive disorder, recurrent severe without psychotic features: Secondary | ICD-10-CM | POA: Diagnosis not present

## 2020-11-12 DIAGNOSIS — F411 Generalized anxiety disorder: Secondary | ICD-10-CM | POA: Diagnosis not present

## 2020-11-13 DIAGNOSIS — F411 Generalized anxiety disorder: Secondary | ICD-10-CM | POA: Diagnosis not present

## 2020-11-13 DIAGNOSIS — F332 Major depressive disorder, recurrent severe without psychotic features: Secondary | ICD-10-CM | POA: Diagnosis not present

## 2020-11-13 DIAGNOSIS — F4323 Adjustment disorder with mixed anxiety and depressed mood: Secondary | ICD-10-CM | POA: Diagnosis not present

## 2020-11-27 DIAGNOSIS — F411 Generalized anxiety disorder: Secondary | ICD-10-CM | POA: Diagnosis not present

## 2020-11-27 DIAGNOSIS — F4321 Adjustment disorder with depressed mood: Secondary | ICD-10-CM | POA: Diagnosis not present

## 2020-11-27 DIAGNOSIS — F332 Major depressive disorder, recurrent severe without psychotic features: Secondary | ICD-10-CM | POA: Diagnosis not present

## 2020-11-27 DIAGNOSIS — F900 Attention-deficit hyperactivity disorder, predominantly inattentive type: Secondary | ICD-10-CM | POA: Diagnosis not present

## 2021-01-01 DIAGNOSIS — R499 Unspecified voice and resonance disorder: Secondary | ICD-10-CM | POA: Diagnosis not present

## 2021-01-01 DIAGNOSIS — B9689 Other specified bacterial agents as the cause of diseases classified elsewhere: Secondary | ICD-10-CM | POA: Diagnosis not present

## 2021-01-01 DIAGNOSIS — J329 Chronic sinusitis, unspecified: Secondary | ICD-10-CM | POA: Diagnosis not present

## 2021-01-01 DIAGNOSIS — J069 Acute upper respiratory infection, unspecified: Secondary | ICD-10-CM | POA: Diagnosis not present

## 2021-02-05 DIAGNOSIS — F411 Generalized anxiety disorder: Secondary | ICD-10-CM | POA: Diagnosis not present

## 2021-02-05 DIAGNOSIS — F332 Major depressive disorder, recurrent severe without psychotic features: Secondary | ICD-10-CM | POA: Diagnosis not present

## 2021-02-06 DIAGNOSIS — F411 Generalized anxiety disorder: Secondary | ICD-10-CM | POA: Diagnosis not present

## 2021-02-06 DIAGNOSIS — F332 Major depressive disorder, recurrent severe without psychotic features: Secondary | ICD-10-CM | POA: Diagnosis not present

## 2021-02-26 DIAGNOSIS — L570 Actinic keratosis: Secondary | ICD-10-CM | POA: Diagnosis not present

## 2021-03-25 DIAGNOSIS — F332 Major depressive disorder, recurrent severe without psychotic features: Secondary | ICD-10-CM | POA: Diagnosis not present

## 2021-03-25 DIAGNOSIS — F411 Generalized anxiety disorder: Secondary | ICD-10-CM | POA: Diagnosis not present

## 2021-03-26 DIAGNOSIS — H04122 Dry eye syndrome of left lacrimal gland: Secondary | ICD-10-CM | POA: Diagnosis not present

## 2021-03-26 DIAGNOSIS — Z006 Encounter for examination for normal comparison and control in clinical research program: Secondary | ICD-10-CM | POA: Diagnosis not present

## 2021-03-26 DIAGNOSIS — H2513 Age-related nuclear cataract, bilateral: Secondary | ICD-10-CM | POA: Diagnosis not present

## 2021-03-26 DIAGNOSIS — Z9889 Other specified postprocedural states: Secondary | ICD-10-CM | POA: Diagnosis not present

## 2021-03-27 DIAGNOSIS — F411 Generalized anxiety disorder: Secondary | ICD-10-CM | POA: Diagnosis not present

## 2021-03-27 DIAGNOSIS — F332 Major depressive disorder, recurrent severe without psychotic features: Secondary | ICD-10-CM | POA: Diagnosis not present

## 2021-04-09 DIAGNOSIS — F411 Generalized anxiety disorder: Secondary | ICD-10-CM | POA: Diagnosis not present

## 2021-04-09 DIAGNOSIS — F332 Major depressive disorder, recurrent severe without psychotic features: Secondary | ICD-10-CM | POA: Diagnosis not present

## 2021-04-28 DIAGNOSIS — F411 Generalized anxiety disorder: Secondary | ICD-10-CM | POA: Diagnosis not present

## 2021-04-28 DIAGNOSIS — F332 Major depressive disorder, recurrent severe without psychotic features: Secondary | ICD-10-CM | POA: Diagnosis not present

## 2021-05-08 DIAGNOSIS — F411 Generalized anxiety disorder: Secondary | ICD-10-CM | POA: Diagnosis not present

## 2021-05-08 DIAGNOSIS — F4321 Adjustment disorder with depressed mood: Secondary | ICD-10-CM | POA: Diagnosis not present

## 2021-05-08 DIAGNOSIS — F332 Major depressive disorder, recurrent severe without psychotic features: Secondary | ICD-10-CM | POA: Diagnosis not present

## 2021-06-09 DIAGNOSIS — F332 Major depressive disorder, recurrent severe without psychotic features: Secondary | ICD-10-CM | POA: Diagnosis not present

## 2021-06-09 DIAGNOSIS — F411 Generalized anxiety disorder: Secondary | ICD-10-CM | POA: Diagnosis not present

## 2021-06-24 DIAGNOSIS — Z1151 Encounter for screening for human papillomavirus (HPV): Secondary | ICD-10-CM | POA: Diagnosis not present

## 2021-06-24 DIAGNOSIS — Z6823 Body mass index (BMI) 23.0-23.9, adult: Secondary | ICD-10-CM | POA: Diagnosis not present

## 2021-06-24 DIAGNOSIS — Z124 Encounter for screening for malignant neoplasm of cervix: Secondary | ICD-10-CM | POA: Diagnosis not present

## 2021-06-24 DIAGNOSIS — Z1231 Encounter for screening mammogram for malignant neoplasm of breast: Secondary | ICD-10-CM | POA: Diagnosis not present

## 2021-06-24 DIAGNOSIS — Z01419 Encounter for gynecological examination (general) (routine) without abnormal findings: Secondary | ICD-10-CM | POA: Diagnosis not present

## 2021-07-03 DIAGNOSIS — F411 Generalized anxiety disorder: Secondary | ICD-10-CM | POA: Diagnosis not present

## 2021-07-03 DIAGNOSIS — F332 Major depressive disorder, recurrent severe without psychotic features: Secondary | ICD-10-CM | POA: Diagnosis not present

## 2021-07-17 DIAGNOSIS — F411 Generalized anxiety disorder: Secondary | ICD-10-CM | POA: Diagnosis not present

## 2021-07-17 DIAGNOSIS — F332 Major depressive disorder, recurrent severe without psychotic features: Secondary | ICD-10-CM | POA: Diagnosis not present

## 2021-07-17 DIAGNOSIS — F902 Attention-deficit hyperactivity disorder, combined type: Secondary | ICD-10-CM | POA: Diagnosis not present

## 2021-08-06 DIAGNOSIS — F411 Generalized anxiety disorder: Secondary | ICD-10-CM | POA: Diagnosis not present

## 2021-08-06 DIAGNOSIS — F332 Major depressive disorder, recurrent severe without psychotic features: Secondary | ICD-10-CM | POA: Diagnosis not present

## 2021-09-18 DIAGNOSIS — Z130 Encounter for screening for diseases of the blood and blood-forming organs and certain disorders involving the immune mechanism: Secondary | ICD-10-CM | POA: Diagnosis not present

## 2021-09-18 DIAGNOSIS — Z0000 Encounter for general adult medical examination without abnormal findings: Secondary | ICD-10-CM | POA: Diagnosis not present

## 2021-09-18 DIAGNOSIS — Z79899 Other long term (current) drug therapy: Secondary | ICD-10-CM | POA: Diagnosis not present

## 2021-09-18 DIAGNOSIS — Z13220 Encounter for screening for lipoid disorders: Secondary | ICD-10-CM | POA: Diagnosis not present

## 2021-10-15 DIAGNOSIS — F332 Major depressive disorder, recurrent severe without psychotic features: Secondary | ICD-10-CM | POA: Diagnosis not present

## 2021-10-15 DIAGNOSIS — F411 Generalized anxiety disorder: Secondary | ICD-10-CM | POA: Diagnosis not present

## 2021-11-06 DIAGNOSIS — F411 Generalized anxiety disorder: Secondary | ICD-10-CM | POA: Diagnosis not present

## 2021-11-06 DIAGNOSIS — F332 Major depressive disorder, recurrent severe without psychotic features: Secondary | ICD-10-CM | POA: Diagnosis not present

## 2021-11-10 DIAGNOSIS — F411 Generalized anxiety disorder: Secondary | ICD-10-CM | POA: Diagnosis not present

## 2021-11-10 DIAGNOSIS — F332 Major depressive disorder, recurrent severe without psychotic features: Secondary | ICD-10-CM | POA: Diagnosis not present

## 2021-11-18 DIAGNOSIS — F332 Major depressive disorder, recurrent severe without psychotic features: Secondary | ICD-10-CM | POA: Diagnosis not present

## 2021-11-18 DIAGNOSIS — F411 Generalized anxiety disorder: Secondary | ICD-10-CM | POA: Diagnosis not present

## 2021-11-19 DIAGNOSIS — Z08 Encounter for follow-up examination after completed treatment for malignant neoplasm: Secondary | ICD-10-CM | POA: Diagnosis not present

## 2021-11-19 DIAGNOSIS — D235 Other benign neoplasm of skin of trunk: Secondary | ICD-10-CM | POA: Diagnosis not present

## 2021-11-19 DIAGNOSIS — L814 Other melanin hyperpigmentation: Secondary | ICD-10-CM | POA: Diagnosis not present

## 2021-11-19 DIAGNOSIS — L815 Leukoderma, not elsewhere classified: Secondary | ICD-10-CM | POA: Diagnosis not present

## 2021-11-19 DIAGNOSIS — L570 Actinic keratosis: Secondary | ICD-10-CM | POA: Diagnosis not present

## 2021-12-03 DIAGNOSIS — F411 Generalized anxiety disorder: Secondary | ICD-10-CM | POA: Diagnosis not present

## 2021-12-03 DIAGNOSIS — F332 Major depressive disorder, recurrent severe without psychotic features: Secondary | ICD-10-CM | POA: Diagnosis not present

## 2022-01-19 DIAGNOSIS — F331 Major depressive disorder, recurrent, moderate: Secondary | ICD-10-CM | POA: Diagnosis not present

## 2022-01-19 DIAGNOSIS — F902 Attention-deficit hyperactivity disorder, combined type: Secondary | ICD-10-CM | POA: Diagnosis not present

## 2022-01-19 DIAGNOSIS — F411 Generalized anxiety disorder: Secondary | ICD-10-CM | POA: Diagnosis not present

## 2022-02-02 DIAGNOSIS — F332 Major depressive disorder, recurrent severe without psychotic features: Secondary | ICD-10-CM | POA: Diagnosis not present

## 2022-02-02 DIAGNOSIS — F411 Generalized anxiety disorder: Secondary | ICD-10-CM | POA: Diagnosis not present

## 2022-02-09 DIAGNOSIS — F411 Generalized anxiety disorder: Secondary | ICD-10-CM | POA: Diagnosis not present

## 2022-02-09 DIAGNOSIS — F332 Major depressive disorder, recurrent severe without psychotic features: Secondary | ICD-10-CM | POA: Diagnosis not present

## 2022-02-19 DIAGNOSIS — F411 Generalized anxiety disorder: Secondary | ICD-10-CM | POA: Diagnosis not present

## 2022-02-19 DIAGNOSIS — F332 Major depressive disorder, recurrent severe without psychotic features: Secondary | ICD-10-CM | POA: Diagnosis not present

## 2022-03-18 DIAGNOSIS — U071 COVID-19: Secondary | ICD-10-CM | POA: Diagnosis not present

## 2022-03-18 DIAGNOSIS — R059 Cough, unspecified: Secondary | ICD-10-CM | POA: Diagnosis not present

## 2022-03-24 DIAGNOSIS — R0989 Other specified symptoms and signs involving the circulatory and respiratory systems: Secondary | ICD-10-CM | POA: Diagnosis not present

## 2022-03-25 DIAGNOSIS — J0100 Acute maxillary sinusitis, unspecified: Secondary | ICD-10-CM | POA: Diagnosis not present

## 2022-03-25 DIAGNOSIS — H6123 Impacted cerumen, bilateral: Secondary | ICD-10-CM | POA: Diagnosis not present

## 2022-04-28 DIAGNOSIS — F411 Generalized anxiety disorder: Secondary | ICD-10-CM | POA: Diagnosis not present

## 2022-04-28 DIAGNOSIS — F332 Major depressive disorder, recurrent severe without psychotic features: Secondary | ICD-10-CM | POA: Diagnosis not present

## 2022-05-27 DIAGNOSIS — D235 Other benign neoplasm of skin of trunk: Secondary | ICD-10-CM | POA: Diagnosis not present

## 2022-05-27 DIAGNOSIS — Z08 Encounter for follow-up examination after completed treatment for malignant neoplasm: Secondary | ICD-10-CM | POA: Diagnosis not present

## 2022-05-27 DIAGNOSIS — L578 Other skin changes due to chronic exposure to nonionizing radiation: Secondary | ICD-10-CM | POA: Diagnosis not present

## 2022-05-27 DIAGNOSIS — Z85828 Personal history of other malignant neoplasm of skin: Secondary | ICD-10-CM | POA: Diagnosis not present

## 2022-05-27 DIAGNOSIS — L570 Actinic keratosis: Secondary | ICD-10-CM | POA: Diagnosis not present

## 2022-08-26 DIAGNOSIS — F332 Major depressive disorder, recurrent severe without psychotic features: Secondary | ICD-10-CM | POA: Diagnosis not present

## 2022-08-26 DIAGNOSIS — F411 Generalized anxiety disorder: Secondary | ICD-10-CM | POA: Diagnosis not present

## 2022-08-31 DIAGNOSIS — Z1231 Encounter for screening mammogram for malignant neoplasm of breast: Secondary | ICD-10-CM | POA: Diagnosis not present

## 2022-08-31 DIAGNOSIS — Z01419 Encounter for gynecological examination (general) (routine) without abnormal findings: Secondary | ICD-10-CM | POA: Diagnosis not present

## 2022-08-31 DIAGNOSIS — Z6822 Body mass index (BMI) 22.0-22.9, adult: Secondary | ICD-10-CM | POA: Diagnosis not present

## 2022-08-31 DIAGNOSIS — Z13820 Encounter for screening for osteoporosis: Secondary | ICD-10-CM | POA: Diagnosis not present

## 2022-09-15 DIAGNOSIS — Z0000 Encounter for general adult medical examination without abnormal findings: Secondary | ICD-10-CM | POA: Diagnosis not present

## 2022-09-15 DIAGNOSIS — R6882 Decreased libido: Secondary | ICD-10-CM | POA: Diagnosis not present

## 2022-09-15 DIAGNOSIS — Z13220 Encounter for screening for lipoid disorders: Secondary | ICD-10-CM | POA: Diagnosis not present

## 2022-09-16 DIAGNOSIS — M47816 Spondylosis without myelopathy or radiculopathy, lumbar region: Secondary | ICD-10-CM | POA: Diagnosis not present

## 2022-09-16 DIAGNOSIS — M4326 Fusion of spine, lumbar region: Secondary | ICD-10-CM | POA: Diagnosis not present

## 2022-10-08 DIAGNOSIS — H00019 Hordeolum externum unspecified eye, unspecified eyelid: Secondary | ICD-10-CM | POA: Diagnosis not present

## 2022-11-02 DIAGNOSIS — J069 Acute upper respiratory infection, unspecified: Secondary | ICD-10-CM | POA: Diagnosis not present

## 2022-11-02 DIAGNOSIS — Z03818 Encounter for observation for suspected exposure to other biological agents ruled out: Secondary | ICD-10-CM | POA: Diagnosis not present

## 2022-12-01 DIAGNOSIS — D235 Other benign neoplasm of skin of trunk: Secondary | ICD-10-CM | POA: Diagnosis not present

## 2022-12-01 DIAGNOSIS — L815 Leukoderma, not elsewhere classified: Secondary | ICD-10-CM | POA: Diagnosis not present

## 2022-12-01 DIAGNOSIS — L578 Other skin changes due to chronic exposure to nonionizing radiation: Secondary | ICD-10-CM | POA: Diagnosis not present

## 2022-12-01 DIAGNOSIS — L570 Actinic keratosis: Secondary | ICD-10-CM | POA: Diagnosis not present

## 2022-12-03 DIAGNOSIS — F411 Generalized anxiety disorder: Secondary | ICD-10-CM | POA: Diagnosis not present

## 2022-12-03 DIAGNOSIS — F332 Major depressive disorder, recurrent severe without psychotic features: Secondary | ICD-10-CM | POA: Diagnosis not present

## 2022-12-07 DIAGNOSIS — J0100 Acute maxillary sinusitis, unspecified: Secondary | ICD-10-CM | POA: Diagnosis not present

## 2022-12-07 DIAGNOSIS — J069 Acute upper respiratory infection, unspecified: Secondary | ICD-10-CM | POA: Diagnosis not present

## 2022-12-07 DIAGNOSIS — G9339 Other post infection and related fatigue syndromes: Secondary | ICD-10-CM | POA: Diagnosis not present

## 2022-12-10 DIAGNOSIS — Z1211 Encounter for screening for malignant neoplasm of colon: Secondary | ICD-10-CM | POA: Diagnosis not present

## 2022-12-10 DIAGNOSIS — K5900 Constipation, unspecified: Secondary | ICD-10-CM | POA: Diagnosis not present

## 2023-01-06 DIAGNOSIS — F411 Generalized anxiety disorder: Secondary | ICD-10-CM | POA: Diagnosis not present

## 2023-01-06 DIAGNOSIS — F332 Major depressive disorder, recurrent severe without psychotic features: Secondary | ICD-10-CM | POA: Diagnosis not present

## 2023-02-12 DIAGNOSIS — K5730 Diverticulosis of large intestine without perforation or abscess without bleeding: Secondary | ICD-10-CM | POA: Diagnosis not present

## 2023-02-12 DIAGNOSIS — D123 Benign neoplasm of transverse colon: Secondary | ICD-10-CM | POA: Diagnosis not present

## 2023-02-12 DIAGNOSIS — K635 Polyp of colon: Secondary | ICD-10-CM | POA: Diagnosis not present

## 2023-02-12 DIAGNOSIS — Z860102 Personal history of hyperplastic colon polyps: Secondary | ICD-10-CM | POA: Diagnosis not present

## 2023-02-12 DIAGNOSIS — Z1211 Encounter for screening for malignant neoplasm of colon: Secondary | ICD-10-CM | POA: Diagnosis not present

## 2023-02-12 DIAGNOSIS — Z09 Encounter for follow-up examination after completed treatment for conditions other than malignant neoplasm: Secondary | ICD-10-CM | POA: Diagnosis not present

## 2023-03-25 DIAGNOSIS — J209 Acute bronchitis, unspecified: Secondary | ICD-10-CM | POA: Diagnosis not present

## 2023-03-25 DIAGNOSIS — J0100 Acute maxillary sinusitis, unspecified: Secondary | ICD-10-CM | POA: Diagnosis not present

## 2023-04-07 DIAGNOSIS — Z6824 Body mass index (BMI) 24.0-24.9, adult: Secondary | ICD-10-CM | POA: Diagnosis not present

## 2023-04-07 DIAGNOSIS — Z713 Dietary counseling and surveillance: Secondary | ICD-10-CM | POA: Diagnosis not present

## 2023-04-10 DIAGNOSIS — N3001 Acute cystitis with hematuria: Secondary | ICD-10-CM | POA: Diagnosis not present

## 2023-04-10 DIAGNOSIS — R300 Dysuria: Secondary | ICD-10-CM | POA: Diagnosis not present

## 2023-04-28 DIAGNOSIS — J101 Influenza due to other identified influenza virus with other respiratory manifestations: Secondary | ICD-10-CM | POA: Diagnosis not present

## 2023-05-20 DIAGNOSIS — F332 Major depressive disorder, recurrent severe without psychotic features: Secondary | ICD-10-CM | POA: Diagnosis not present

## 2023-05-20 DIAGNOSIS — F411 Generalized anxiety disorder: Secondary | ICD-10-CM | POA: Diagnosis not present

## 2023-06-07 DIAGNOSIS — L218 Other seborrheic dermatitis: Secondary | ICD-10-CM | POA: Diagnosis not present

## 2023-06-07 DIAGNOSIS — L570 Actinic keratosis: Secondary | ICD-10-CM | POA: Diagnosis not present

## 2023-06-07 DIAGNOSIS — Z7189 Other specified counseling: Secondary | ICD-10-CM | POA: Diagnosis not present

## 2023-06-07 DIAGNOSIS — D235 Other benign neoplasm of skin of trunk: Secondary | ICD-10-CM | POA: Diagnosis not present

## 2023-06-17 DIAGNOSIS — F411 Generalized anxiety disorder: Secondary | ICD-10-CM | POA: Diagnosis not present

## 2023-06-17 DIAGNOSIS — F332 Major depressive disorder, recurrent severe without psychotic features: Secondary | ICD-10-CM | POA: Diagnosis not present

## 2023-06-22 DIAGNOSIS — F411 Generalized anxiety disorder: Secondary | ICD-10-CM | POA: Diagnosis not present

## 2023-07-12 DIAGNOSIS — J0100 Acute maxillary sinusitis, unspecified: Secondary | ICD-10-CM | POA: Diagnosis not present

## 2023-07-13 DIAGNOSIS — Z713 Dietary counseling and surveillance: Secondary | ICD-10-CM | POA: Diagnosis not present

## 2023-07-13 DIAGNOSIS — Z6823 Body mass index (BMI) 23.0-23.9, adult: Secondary | ICD-10-CM | POA: Diagnosis not present

## 2023-08-11 DIAGNOSIS — F900 Attention-deficit hyperactivity disorder, predominantly inattentive type: Secondary | ICD-10-CM | POA: Diagnosis not present

## 2023-09-06 DIAGNOSIS — Z1231 Encounter for screening mammogram for malignant neoplasm of breast: Secondary | ICD-10-CM | POA: Diagnosis not present

## 2023-09-06 DIAGNOSIS — Z01419 Encounter for gynecological examination (general) (routine) without abnormal findings: Secondary | ICD-10-CM | POA: Diagnosis not present

## 2023-09-06 DIAGNOSIS — Z6823 Body mass index (BMI) 23.0-23.9, adult: Secondary | ICD-10-CM | POA: Diagnosis not present

## 2023-09-16 DIAGNOSIS — F5101 Primary insomnia: Secondary | ICD-10-CM | POA: Diagnosis not present

## 2023-09-16 DIAGNOSIS — Z0000 Encounter for general adult medical examination without abnormal findings: Secondary | ICD-10-CM | POA: Diagnosis not present

## 2023-09-16 DIAGNOSIS — Z13220 Encounter for screening for lipoid disorders: Secondary | ICD-10-CM | POA: Diagnosis not present

## 2023-09-16 DIAGNOSIS — F909 Attention-deficit hyperactivity disorder, unspecified type: Secondary | ICD-10-CM | POA: Diagnosis not present

## 2023-09-16 DIAGNOSIS — Z79899 Other long term (current) drug therapy: Secondary | ICD-10-CM | POA: Diagnosis not present

## 2023-09-23 DIAGNOSIS — L988 Other specified disorders of the skin and subcutaneous tissue: Secondary | ICD-10-CM | POA: Diagnosis not present

## 2023-09-23 DIAGNOSIS — L738 Other specified follicular disorders: Secondary | ICD-10-CM | POA: Diagnosis not present

## 2023-09-23 DIAGNOSIS — L570 Actinic keratosis: Secondary | ICD-10-CM | POA: Diagnosis not present

## 2023-10-18 DIAGNOSIS — J029 Acute pharyngitis, unspecified: Secondary | ICD-10-CM | POA: Diagnosis not present

## 2023-11-01 DIAGNOSIS — F900 Attention-deficit hyperactivity disorder, predominantly inattentive type: Secondary | ICD-10-CM | POA: Diagnosis not present

## 2024-01-24 DIAGNOSIS — F900 Attention-deficit hyperactivity disorder, predominantly inattentive type: Secondary | ICD-10-CM | POA: Diagnosis not present
